# Patient Record
Sex: Female | Born: 1990 | Race: White | Hispanic: No | Marital: Single | Smoking: Current every day smoker
Health system: Southern US, Community
[De-identification: ages and names within clinical notes are randomized; demographics above are authoritative.]

## PROBLEM LIST (undated history)

## (undated) DIAGNOSIS — F32A Depression, unspecified: Secondary | ICD-10-CM

## (undated) DIAGNOSIS — Z8719 Personal history of other diseases of the digestive system: Secondary | ICD-10-CM

## (undated) DIAGNOSIS — F1010 Alcohol abuse, uncomplicated: Secondary | ICD-10-CM

## (undated) DIAGNOSIS — F419 Anxiety disorder, unspecified: Secondary | ICD-10-CM

## (undated) DIAGNOSIS — F329 Major depressive disorder, single episode, unspecified: Secondary | ICD-10-CM

## (undated) DIAGNOSIS — E872 Acidosis: Secondary | ICD-10-CM

## (undated) HISTORY — PX: OTHER SURGICAL HISTORY: SHX169

---

## 2015-07-13 DIAGNOSIS — E8729 Other acidosis: Secondary | ICD-10-CM

## 2015-07-13 DIAGNOSIS — E872 Acidosis: Secondary | ICD-10-CM

## 2015-07-13 DIAGNOSIS — Z8719 Personal history of other diseases of the digestive system: Secondary | ICD-10-CM

## 2015-07-13 HISTORY — DX: Acidosis: E87.2

## 2015-07-13 HISTORY — DX: Personal history of other diseases of the digestive system: Z87.19

## 2015-07-13 HISTORY — DX: Other acidosis: E87.29

## 2015-09-25 ENCOUNTER — Encounter: Payer: Self-pay | Admitting: Emergency Medicine

## 2015-09-25 ENCOUNTER — Emergency Department
Admission: EM | Admit: 2015-09-25 | Discharge: 2015-09-26 | Disposition: A | Discharge status: Home / Self Care | Payer: BLUE CROSS/BLUE SHIELD | Attending: Emergency Medicine | Admitting: Emergency Medicine

## 2015-09-25 DIAGNOSIS — Z3202 Encounter for pregnancy test, result negative: Secondary | ICD-10-CM | POA: Diagnosis not present

## 2015-09-25 DIAGNOSIS — Z79899 Other long term (current) drug therapy: Secondary | ICD-10-CM | POA: Insufficient documentation

## 2015-09-25 DIAGNOSIS — R05 Cough: Secondary | ICD-10-CM | POA: Insufficient documentation

## 2015-09-25 DIAGNOSIS — E876 Hypokalemia: Secondary | ICD-10-CM | POA: Diagnosis not present

## 2015-09-25 DIAGNOSIS — M791 Myalgia: Secondary | ICD-10-CM | POA: Diagnosis present

## 2015-09-25 DIAGNOSIS — F17210 Nicotine dependence, cigarettes, uncomplicated: Secondary | ICD-10-CM | POA: Diagnosis not present

## 2015-09-25 DIAGNOSIS — F1010 Alcohol abuse, uncomplicated: Secondary | ICD-10-CM | POA: Diagnosis not present

## 2015-09-25 LAB — URINALYSIS COMPLETE WITH MICROSCOPIC (ARMC ONLY)
Bilirubin Urine: NEGATIVE
Glucose, UA: NEGATIVE mg/dL
Hgb urine dipstick: NEGATIVE
KETONES UR: NEGATIVE mg/dL
LEUKOCYTES UA: NEGATIVE
Nitrite: NEGATIVE
PROTEIN: NEGATIVE mg/dL
SPECIFIC GRAVITY, URINE: 1.003 — AB (ref 1.005–1.030)
pH: 7 (ref 5.0–8.0)

## 2015-09-25 LAB — COMPREHENSIVE METABOLIC PANEL
ALK PHOS: 75 U/L (ref 38–126)
ALT: 31 U/L (ref 14–54)
ANION GAP: 9 (ref 5–15)
AST: 85 U/L — AB (ref 15–41)
Albumin: 3.9 g/dL (ref 3.5–5.0)
BILIRUBIN TOTAL: 0.5 mg/dL (ref 0.3–1.2)
BUN: 7 mg/dL (ref 6–20)
CALCIUM: 9 mg/dL (ref 8.9–10.3)
CO2: 28 mmol/L (ref 22–32)
Chloride: 107 mmol/L (ref 101–111)
Creatinine, Ser: 0.46 mg/dL (ref 0.44–1.00)
GFR calc Af Amer: >60 mL/min (ref >60)
Glucose, Bld: 97 mg/dL (ref 65–99)
POTASSIUM: 2.6 mmol/L — AB (ref 3.5–5.1)
Sodium: 144 mmol/L (ref 135–145)
TOTAL PROTEIN: 7.9 g/dL (ref 6.5–8.1)

## 2015-09-25 LAB — LIPASE, BLOOD: Lipase: 14 U/L (ref 11–51)

## 2015-09-25 LAB — CBC
HCT: 39.5 % (ref 35.0–47.0)
HEMOGLOBIN: 13.1 g/dL (ref 12.0–16.0)
MCH: 30.7 pg (ref 26.0–34.0)
MCHC: 33.2 g/dL (ref 32.0–36.0)
MCV: 92.7 fL (ref 80.0–100.0)
PLATELETS: 258 10*3/uL (ref 150–440)
RBC: 4.27 MIL/uL (ref 3.80–5.20)
RDW: 13.4 % (ref 11.5–14.5)
WBC: 7 10*3/uL (ref 3.6–11.0)

## 2015-09-25 LAB — URINE DRUG SCREEN, QUALITATIVE (ARMC ONLY)
Amphetamines, Ur Screen: NOT DETECTED
Barbiturates, Ur Screen: NOT DETECTED
Benzodiazepine, Ur Scrn: NOT DETECTED
COCAINE METABOLITE, UR ~~LOC~~: NOT DETECTED
Cannabinoid 50 Ng, Ur ~~LOC~~: POSITIVE — AB
MDMA (Ecstasy)Ur Screen: NOT DETECTED
METHADONE SCREEN, URINE: NOT DETECTED
Opiate, Ur Screen: NOT DETECTED
Phencyclidine (PCP) Ur S: NOT DETECTED
TRICYCLIC, UR SCREEN: NOT DETECTED

## 2015-09-25 LAB — ETHANOL: ALCOHOL ETHYL (B): 418 mg/dL — AB (ref <5)

## 2015-09-25 LAB — INFLUENZA PANEL BY PCR (TYPE A & B)
H1N1 flu by pcr: NOT DETECTED
INFLBPCR: NEGATIVE
Influenza A By PCR: NEGATIVE

## 2015-09-25 LAB — PREGNANCY, URINE: PREG TEST UR: NEGATIVE

## 2015-09-25 LAB — ACETAMINOPHEN LEVEL: Acetaminophen (Tylenol), Serum: <10 ug/mL — ABNORMAL LOW (ref 10–30)

## 2015-09-25 LAB — SALICYLATE LEVEL: Salicylate Lvl: <4 mg/dL (ref 2.8–30.0)

## 2015-09-25 MED ORDER — LORAZEPAM 2 MG/ML IJ SOLN: 0.0000 mg | Freq: Two times a day (BID) | INTRAMUSCULAR | Status: DC

## 2015-09-25 MED ORDER — LORAZEPAM 2 MG/ML IJ SOLN: 0.0000 mg | Freq: Four times a day (QID) | INTRAMUSCULAR | Status: DC

## 2015-09-25 MED ORDER — LORAZEPAM 2 MG PO TABS: 0.0000 mg | ORAL_TABLET | Freq: Two times a day (BID) | ORAL | Status: DC

## 2015-09-25 MED ORDER — THIAMINE HCL 100 MG/ML IJ SOLN: 100.0000 mg | Freq: Every day | INTRAMUSCULAR | Status: DC

## 2015-09-25 MED ORDER — LORAZEPAM 2 MG PO TABS: 0.0000 mg | ORAL_TABLET | Freq: Four times a day (QID) | ORAL | Status: DC

## 2015-09-25 MED ORDER — VITAMIN B-1 100 MG PO TABS: 100.0000 mg | ORAL_TABLET | Freq: Every day | ORAL | Status: DC

## 2015-09-25 MED ORDER — POTASSIUM CHLORIDE IN NACL 20-0.9 MEQ/L-% IV SOLN
Freq: Once | INTRAVENOUS | Status: AC
  Administered 2015-09-25: 22:00:00 via INTRAVENOUS
  Filled 2015-09-25: qty 1000

## 2015-09-25 NOTE — ED Notes (Signed)
Pt sleeping.  nsr on monitor. Skin warm and dry.

## 2015-09-25 NOTE — ED Notes (Signed)
MD malinda at bedside. 

## 2015-09-25 NOTE — [facility] Counselor (Signed)
Per EDP Dr.Malinda, Pt is unable to be assessed at this time [facility]. EDP also communicated that pt  Denies SI, HI and Psychosis and will need to be assisted with [facility] placement.

## 2015-09-25 NOTE — ED Provider Notes (Signed)
[facility] Emergency Department Provider Note  ____________________________________________  Time seen: Approximately 9:24 PM  I have reviewed the triage vital signs and the nursing notes.   HISTORY  Chief Complaint Generalized Body Aches    HPI Eileen Ramos is a 24 y.o. female patient reports she's been drinking 2 shots of alcohol a day. She feels bad she is now feeling achy all over and has been for a couple days. She's not sure if she has a fever. She says she has a cough she thinks it might be a smoker's cough she does smoke a lot. She is part says the cough is productive of a small amount of white phlegm. She denies any abdominal pain urinary symptoms or any other complaints just says she's aching all over mostly.   History reviewed. No pertinent past medical history.  There are no active problems to display for this patient.   History reviewed. No pertinent past surgical history.  Current Outpatient Rx  Name  Route  Sig  Dispense  Refill  . clonazePAM (KLONOPIN) 0.5 MG tablet   Oral   Take 0.5 mg by mouth 3 (three) times daily as needed for anxiety.         Marland Kitchen venlafaxine XR (EFFEXOR-XR) 150 MG 24 hr capsule   Oral   Take 300 mg by mouth daily with breakfast.           Allergies Review of patient's allergies indicates no known allergies.  History reviewed. No pertinent family history.  Social History Social History  Substance Use Topics  . Smoking status: Current Every Day Smoker -- 1.00 packs/day    Types: Cigarettes  . Smokeless tobacco: None  . Alcohol Use: Yes     Comment: a couple of drinks every 4 to 5 days    Review of Systems Constitutional: See history of present illness Eyes: No visual changes. ENT: No sore throat. Cardiovascular: Denies chest pain. Respiratory: Denies shortness of breath. Gastrointestinal: No abdominal pain.  No nausea, no vomiting.  No diarrhea.  No constipation. Genitourinary: Negative for  dysuria. Musculoskeletal: Negative for back pain. Skin: Negative for rash. Neurological: Negative for headaches, focal weakness or numbness.  10-point ROS otherwise negative.  ____________________________________________   PHYSICAL EXAM:  VITAL SIGNS: ED Triage Vitals  Enc Vitals Group     BP 09/25/15 2026 93/59 mmHg     Pulse Rate 09/25/15 2026 91     Resp 09/25/15 2026 18     Temp 09/25/15 2026 98 F (36.7 C)     Temp Source 09/25/15 2026 Oral     SpO2 09/25/15 2026 95 %     Weight 09/25/15 2026 160 lb (72.576 kg)     Height 09/25/15 2026  (1.727 m)     Head Cir --      Peak Flow --      Pain Score 09/25/15 2026 7     Pain Loc --      Pain Edu? --      Excl. in GC? --     Constitutional: Alert and oriented. Appears mildly chronically ill Eyes: Conjunctivae are normal. PERRL. EOMI. Head: Atraumatic. Nose: No congestion/rhinnorhea. Mouth/Throat: Mucous membranes are moist.  Oropharynx non-erythematous. Neck: No stridor.  Cardiovascular: Normal rate, regular rhythm. Grossly normal heart sounds.  Good peripheral circulation. Respiratory: Normal respiratory effort.  No retractions. Lungs CTAB. Gastrointestinal: Soft and patient reports her belly "does not hurt that much" this is a diffuse feeling.. No distention. No abdominal  bruits. No CVA tenderness. Musculoskeletal: No lower extremity tenderness nor edema.  No joint effusions. Neurologic:  Normal speech and language. No gross focal neurologic deficits are appreciated. No gait instability. Skin:  Skin is warm, dry and intact. No rash noted.   ____________________________________________   LABS (all labs ordered are listed, but only abnormal results are displayed)  Labs Reviewed  COMPREHENSIVE METABOLIC PANEL - Abnormal; Notable for the following:    Potassium 2.6 (*)    AST 85 (*)    All other components within normal limits  ETHANOL - Abnormal; Notable for the following:    Alcohol, Ethyl (B) 418 (*)     All other components within normal limits  URINE DRUG SCREEN, QUALITATIVE ([facility] ONLY) - Abnormal; Notable for the following:    Cannabinoid 50 Ng, Ur [place] POSITIVE (*)    All other components within normal limits  URINALYSIS COMPLETEWITH MICROSCOPIC ([facility] ONLY) - Abnormal; Notable for the following:    Color, Urine YELLOW (*)    APPearance CLEAR (*)    Specific Gravity, Urine 1.003 (*)    Bacteria, UA RARE (*)    Squamous Epithelial / LPF 0-5 (*)    All other components within normal limits  CBC  PREGNANCY, URINE  ACETAMINOPHEN LEVEL  SALICYLATE LEVEL  INFLUENZA PANEL BY PCR (TYPE A & B, H1N1)  LIPASE, BLOOD   ____________________________________________  EKG   ____________________________________________  RADIOLOGY   ____________________________________________   PROCEDURES    ____________________________________________   INITIAL IMPRESSION / ASSESSMENT AND PLAN / ED COURSE  Pertinent labs & imaging results that were available during my care of the patient were reviewed by me and considered in my medical decision making (see chart for details). Will start normal saline at with 20 of K2 100 hours because patient's potassium is low her call us for 12 patient wakes up. He put the IV and she had been sleeping and again denies homicidal or suicidal ideation patient's friends tell the nurse [facility] a [facility]. Apparently they brought her in because she didn't want to come she was taking care of her father at home who has stage IV prostate cancer  ____________________________________________   FINAL CLINICAL IMPRESSION(S) / ED DIAGNOSES  Final diagnoses:  Alcohol abuse      Arnaldo NatalPaul F Laquanna Veazey, MD 09/25/15 2300

## 2015-09-25 NOTE — ED Notes (Signed)
Lab called for add on orders.  

## 2015-09-25 NOTE — ED Notes (Signed)
Pt has been put on cardiac monitor, IV started and fluid bolus. Pt denies any HI or SI at this time. Pt calm and cooperative

## 2015-09-25 NOTE — ED Notes (Signed)
Pt states she has felt under the weather for the last couple of days. Pt states "my whole body feels blue." Pt unable to elaborate on that at this time. Pt complains of pain all over. Pt states she is a little depressed but states she does not currently have suicidal thoughts. Pt states she has had a couple of drinks today. Pt states she drinks every 4 to 5 days. Pt denies drug use. Pt slow to respond and does not answer all questions appropriately during triage.

## 2015-09-25 NOTE — ED Notes (Signed)
Pt here for alcohol detox, hx of detox in the past.

## 2015-09-25 NOTE — ED Notes (Signed)
Lab called with etoh of .418 and potassium of 2.6    Dr Darnelle Catalanmalinda aware.

## 2015-09-26 MED ORDER — POTASSIUM CHLORIDE CRYS ER 20 MEQ PO TBCR
40.0000 meq | EXTENDED_RELEASE_TABLET | Freq: Two times a day (BID) | ORAL | Status: DC
Start: 2015-09-26 — End: 2015-09-26
  Administered 2015-09-26: 40 meq via ORAL
  Filled 2015-09-26: qty 2

## 2015-09-26 MED ORDER — POTASSIUM CHLORIDE ER 10 MEQ PO TBCR: 40.0000 meq | EXTENDED_RELEASE_TABLET | Freq: Once | ORAL | Status: DC

## 2015-09-26 NOTE — ED Notes (Signed)
Pt waiting for friends to pick her up then will be given d/c paperwork

## 2015-09-26 NOTE — ED Provider Notes (Signed)
-----------------------------------------   1:38 AM on 09/26/2015 -----------------------------------------   Blood pressure 106/76, pulse 93, temperature 98 F (36.7 C), temperature source Oral, resp. rate 20, height 5\' 8"  (1.727 m), weight 160 lb (72.576 kg), last menstrual period 08/27/2015, SpO2 95 %.  The patient had no acute events since last update.  Calm and cooperative at this time.  Patient not wanting to go home. She is now clinically sober. Able to walk without any assistance with a normal gait. She is calm and reasonable. No slurring of her speech. She said that she would like information for alcohol and substance abuse.  Given contact information for both [facility] as well as [facility]. Will be transported home and supervise by her father. Continues to deny any suicidal or homicidal ideation. Patient will also be given dose of by mouth potassium before she leaves as well as a prescription for 40 mEq to be taken once at home.She has received about 10 mEq via the IV.    Myrna Blazeravid Matthew Shaneika Rossa, MD 09/26/15 [phone]

## 2015-09-26 NOTE — ED Notes (Signed)
MD at bedside. 

## 2015-09-26 NOTE — Discharge Instructions (Signed)
Alcohol Abuse and Nutrition Alcohol abuse is any pattern of alcohol consumption that harms your health, relationships, or work. Alcohol abuse can affect how your body breaks down and absorbs nutrients from food by causing your liver to work abnormally. Additionally, many people who abuse alcohol do not eat enough carbohydrates, protein, fat, vitamins, and minerals. This can cause poor nutrition (malnutrition) and a lack of nutrients (nutrient deficiencies), which can lead to further complications. Nutrients that are commonly lacking (deficient) among people who abuse alcohol include:  Vitamins.  Vitamin A. This is stored in your liver. It is important for your vision, metabolism, and ability to fight off infections (immunity).  B vitamins. These include vitamins such as folate, thiamin, and niacin. These are important in new cell growth and maintenance.  Vitamin C. This plays an important role in iron absorption, wound healing, and immunity.  Vitamin D. This is produced by your liver, but you can also get vitamin D from food. Vitamin D is necessary for your body to absorb and use calcium.  Minerals.  Calcium. This is important for your bones and your heart and blood vessel (cardiovascular) function.  Iron. This is important for blood, muscle, and nervous system functioning.  Magnesium. This plays an important role in muscle and nerve function, and it helps to control blood sugar and blood pressure.  Zinc. This is important for the normal function of your nervous system and digestive system (gastrointestinal tract). Nutrition is an essential component of therapy for alcohol abuse. Your health care provider or dietitian will work with you to design a plan that can help restore nutrients to your body and prevent potential complications. WHAT IS MY PLAN? Your dietitian may develop a specific diet plan that is based on your condition and any other complications you may have. A diet plan will  commonly include:  A balanced diet.  Grains: 6-8 oz per day.  Vegetables: 2-3 cups per day.  Fruits: 1-2 cups per day.  Meat and other protein: 5-6 oz per day.  Dairy: 2-3 cups per day.  Vitamin and mineral supplements. WHAT DO I NEED TO KNOW ABOUT ALCOHOL AND NUTRITION?  Consume foods that are high in antioxidants, such as grapes, berries, nuts, green tea, and dark green and orange vegetables. This can help to counteract some of the stress that is placed on your liver by consuming alcohol.  Avoid food and drinks that are high in fat and sugar. Foods such as sugared soft drinks, salty snack foods, and candy contain empty calories. This means that they lack important nutrients such as protein, fiber, and vitamins.  Eat frequent meals and snacks. Try to eat 5-6 small meals each day.  Eat a variety of fresh fruits and vegetables each day. This will help you get plenty of water, fiber, and vitamins in your diet.  Drink plenty of water and other clear fluids. Try to drink at least 48-64 oz (1.5-2 L) of water per day.  If you are a vegetarian, eat a variety of protein-rich foods. Pair whole grains with plant-based proteins at meals and snacks to obtain the greatest nutrient benefit from your food. For example, eat rice with beans, put peanut butter on whole-grain toast, or eat oatmeal with sunflower seeds.  Soak beans and whole grains overnight before cooking. This can help your body to absorb the nutrients more easily.  Include foods fortified with vitamins and minerals in your diet. Commonly fortified foods include milk, orange juice, cereal, and bread.  If you  are malnourished, your dietitian may recommend a high-protein, high-calorie diet. This may include:  2,000-3,000 calories (kilocalories) per day.  70-100 grams of protein per day.  Your health care provider may recommend a complete nutritional supplement beverage. This can help to restore calories, protein, and vitamins to  your body. Depending on your condition, you may be advised to consume this instead of or in addition to meals.  Limit your intake of caffeine. Replace drinks like coffee and black tea with decaffeinated coffee and herbal tea.  Eat a variety of foods that are high in omega fatty acids. These include fish, nuts and seeds, and soybeans. These foods may help your liver to recover and may also stabilize your mood.  Certain medicines may cause changes in your appetite, taste, and weight. Work with your health care provider and dietitian to make any adjustments to your medicines and diet plan.  Include other healthy lifestyle choices in your daily routine.  Be physically active.  Get enough sleep.  Spend time doing activities that you enjoy.  If you are unable to take in enough food and calories by mouth, your health care provider may recommend a feeding tube. This is a tube that passes through your nose and throat, directly into your stomach. Nutritional supplement beverages can be given to you through the feeding tube to help you get the nutrients you need.  Take vitamin or mineral supplements as recommended by your health care provider. WHAT FOODS CAN I EAT? Grains Enriched pasta. Enriched rice. Fortified whole-grain bread. Fortified whole-grain cereal. Barley. Brown rice. Quinoa. Erwin. Vegetables All fresh, frozen, and canned vegetables. Spinach. Kale. Artichoke. Carrots. Winter squash and pumpkin. Sweet potatoes. Broccoli. Cabbage. Cucumbers. Tomatoes. Sweet peppers. Green beans. Peas. Corn. Fruits All fresh and frozen fruits. Berries. Grapes. Mango. Papaya. Guava. Cherries. Apples. Bananas. Peaches. Plums. Pineapple. Watermelon. Cantaloupe. Oranges. Avocado. Meats and Other Protein Sources Beef liver. Lean beef. Pork. Fresh and canned chicken. Fresh fish. Oysters. Sardines. Canned tuna. Shrimp. Eggs with yolks. Nuts and seeds. Peanut butter. Beans and lentils. Soybeans.  Tofu. Dairy Whole, low-fat, and nonfat milk. Whole, low-fat, and nonfat yogurt. Cottage cheese. Sour cream. Hard and soft cheeses. Beverages Water. Herbal tea. Decaffeinated coffee. Decaffeinated green tea. 100% fruit juice. 100% vegetable juice. Instant breakfast shakes. Condiments Ketchup. Mayonnaise. Mustard. Salad dressing. Barbecue sauce. Sweets and Desserts Sugar-free ice cream. Sugar-free pudding. Sugar-free gelatin. Fats and Oils Butter. Vegetable oil, flaxseed oil, olive oil, and walnut oil. Other Complete nutrition shakes. Protein bars. Sugar-free gum. The items listed above may not be a complete list of recommended foods or beverages. Contact your dietitian for more options. WHAT FOODS ARE NOT RECOMMENDED? Grains Sugar-sweetened breakfast cereals. Flavored instant oatmeal. Fried breads. Vegetables Breaded or deep-fried vegetables. Fruits Dried fruit with added sugar. Candied fruit. Canned fruit in syrup. Meats and Other Protein Sources Breaded or deep-fried meats. Dairy Flavored milks. Fried cheese curds or fried cheese sticks. Beverages Alcohol. Sugar-sweetened soft drinks. Sugar-sweetened tea. Caffeinated coffee and tea. Condiments Sugar. Honey. Agave nectar. Molasses. Sweets and Desserts Chocolate. Cake. Cookies. Candy. Other Potato chips. Pretzels. Salted nuts. Candied nuts. The items listed above may not be a complete list of foods and beverages to avoid. Contact your dietitian for more information.   This information is not intended to replace advice given to you by your health care provider. Make sure you discuss any questions you have with your health care provider.   Document Released: 07/23/2005 Document Revised: 10/19/2014 Document Reviewed: 05/01/2014 Elsevier Interactive Patient  Education 2016 [employer]  Hypokalemia Hypokalemia means that the amount of potassium in the blood is lower than normal.Potassium is a chemical, called an electrolyte,  that helps regulate the amount of fluid in the body. It also stimulates muscle contraction and helps nerves function properly.Most of the body's potassium is inside of cells, and only a very small amount is in the blood. Because the amount in the blood is so small, minor changes can be life-threatening. CAUSES  Antibiotics.  Diarrhea or vomiting.  Using laxatives too much, which can cause diarrhea.  Chronic kidney disease.  Water pills (diuretics).  Eating disorders (bulimia).  Low magnesium level.  Sweating a lot. SIGNS AND SYMPTOMS  Weakness.  Constipation.  Fatigue.  Muscle cramps.  Mental confusion.  Skipped heartbeats or irregular heartbeat (palpitations).  Tingling or numbness. DIAGNOSIS  Your health care provider can diagnose hypokalemia with blood tests. In addition to checking your potassium level, your health care provider may also check other lab tests. TREATMENT Hypokalemia can be treated with potassium supplements taken by mouth or adjustments in your current medicines. If your potassium level is very low, you may need to get potassium through a vein (IV) and be monitored in the hospital. A diet high in potassium is also helpful. Foods high in potassium are:  Nuts, such as peanuts and pistachios.  Seeds, such as sunflower seeds and pumpkin seeds.  Peas, lentils, and lima beans.  Whole grain and bran cereals and breads.  Fresh fruit and vegetables, such as apricots, avocado, bananas, cantaloupe, kiwi, oranges, tomatoes, asparagus, and potatoes.  Orange and tomato juices.  Red meats.  Fruit yogurt. HOME CARE INSTRUCTIONS  Take all medicines as prescribed by your health care provider.  Maintain a healthy diet by including nutritious food, such as fruits, vegetables, nuts, whole grains, and lean meats.  If you are taking a laxative, be sure to follow the directions on the label. SEEK MEDICAL CARE IF:  Your weakness gets worse.  You feel your  heart pounding or racing.  You are vomiting or having diarrhea.  You are diabetic and having trouble keeping your blood glucose in the normal range. SEEK IMMEDIATE MEDICAL CARE IF:  You have chest pain, shortness of breath, or dizziness.  You are vomiting or having diarrhea for more than 2 days.  You faint. MAKE SURE YOU:   Understand these instructions.  Will watch your condition.  Will get help right away if you are not doing well or get worse.   This information is not intended to replace advice given to you by your health care provider. Make sure you discuss any questions you have with your health care provider.   Document Released: 09/28/2005 Document Revised: 10/19/2014 Document Reviewed: 03/31/2013 Elsevier Interactive Patient Education [employer] Elsevier Inc.

## 2015-09-26 NOTE — B[facility] Counselor (Signed)
Per EDP Dr.Schaevit Pt is to be discharged.   Per EDP request, writer provided the pt. with information and instructions on how to access Outpatient [facility] Health & [facility]stance [facility] Treatment ([facility], [facility] and [facility])

## 2015-09-26 NOTE — ED Notes (Addendum)
Pt verbalizes she has to go home, she has her dad and dogs to take care of. RN encouraged pt to stay,Pt denies any SI or HI. MD made aware. Will continue to monitor

## 2015-09-26 NOTE — ED Notes (Signed)
TTS at bedside. 

## 2016-01-05 ENCOUNTER — Inpatient Hospital Stay: Payer: BLUE CROSS/BLUE SHIELD

## 2016-01-05 ENCOUNTER — Encounter: Payer: Self-pay | Admitting: Emergency Medicine

## 2016-01-05 ENCOUNTER — Inpatient Hospital Stay
Admission: EM | Admit: 2016-01-05 | Discharge: 2016-01-06 | DRG: 641 | Disposition: A | Discharge status: Home / Self Care | Payer: BLUE CROSS/BLUE SHIELD | Attending: Internal Medicine | Admitting: Internal Medicine

## 2016-01-05 DIAGNOSIS — K920 Hematemesis: Secondary | ICD-10-CM | POA: Diagnosis present

## 2016-01-05 DIAGNOSIS — Z811 Family history of alcohol abuse and dependence: Secondary | ICD-10-CM

## 2016-01-05 DIAGNOSIS — E861 Hypovolemia: Secondary | ICD-10-CM | POA: Diagnosis present

## 2016-01-05 DIAGNOSIS — F17210 Nicotine dependence, cigarettes, uncomplicated: Secondary | ICD-10-CM | POA: Diagnosis present

## 2016-01-05 DIAGNOSIS — R7989 Other specified abnormal findings of blood chemistry: Secondary | ICD-10-CM

## 2016-01-05 DIAGNOSIS — E872 Acidosis: Secondary | ICD-10-CM | POA: Diagnosis present

## 2016-01-05 DIAGNOSIS — F419 Anxiety disorder, unspecified: Secondary | ICD-10-CM | POA: Diagnosis present

## 2016-01-05 DIAGNOSIS — F329 Major depressive disorder, single episode, unspecified: Secondary | ICD-10-CM | POA: Diagnosis present

## 2016-01-05 DIAGNOSIS — K7010 Alcoholic hepatitis without ascites: Secondary | ICD-10-CM | POA: Diagnosis present

## 2016-01-05 DIAGNOSIS — R1110 Vomiting, unspecified: Secondary | ICD-10-CM

## 2016-01-05 DIAGNOSIS — E876 Hypokalemia: Secondary | ICD-10-CM | POA: Diagnosis not present

## 2016-01-05 DIAGNOSIS — E860 Dehydration: Secondary | ICD-10-CM | POA: Diagnosis present

## 2016-01-05 DIAGNOSIS — E871 Hypo-osmolality and hyponatremia: Principal | ICD-10-CM | POA: Diagnosis present

## 2016-01-05 DIAGNOSIS — Z79899 Other long term (current) drug therapy: Secondary | ICD-10-CM

## 2016-01-05 DIAGNOSIS — F1010 Alcohol abuse, uncomplicated: Secondary | ICD-10-CM | POA: Diagnosis present

## 2016-01-05 DIAGNOSIS — E8729 Other acidosis: Secondary | ICD-10-CM | POA: Diagnosis present

## 2016-01-05 DIAGNOSIS — R945 Abnormal results of liver function studies: Secondary | ICD-10-CM

## 2016-01-05 HISTORY — DX: Major depressive disorder, single episode, unspecified: F32.9

## 2016-01-05 HISTORY — DX: Personal history of other diseases of the digestive system: Z87.19

## 2016-01-05 HISTORY — DX: Anxiety disorder, unspecified: F41.9

## 2016-01-05 HISTORY — DX: Depression, unspecified: F32.A

## 2016-01-05 HISTORY — DX: Alcohol abuse, uncomplicated: F10.10

## 2016-01-05 HISTORY — DX: Acidosis: E87.2

## 2016-01-05 LAB — LIPASE, BLOOD: Lipase: 45 U/L (ref 11–51)

## 2016-01-05 LAB — MAGNESIUM
MAGNESIUM: 2.9 mg/dL — AB (ref 1.7–2.4)
Magnesium: 2.6 mg/dL — ABNORMAL HIGH (ref 1.7–2.4)

## 2016-01-05 LAB — URINE DRUG SCREEN, QUALITATIVE (ARMC ONLY)
AMPHETAMINES, UR SCREEN: NOT DETECTED
BARBITURATES, UR SCREEN: NOT DETECTED
BENZODIAZEPINE, UR SCRN: NOT DETECTED
CANNABINOID 50 NG, UR ~~LOC~~: POSITIVE — AB
Cocaine Metabolite,Ur ~~LOC~~: NOT DETECTED
MDMA (ECSTASY) UR SCREEN: NOT DETECTED
METHADONE SCREEN, URINE: NOT DETECTED
OPIATE, UR SCREEN: NOT DETECTED
PHENCYCLIDINE (PCP) UR S: NOT DETECTED
TRICYCLIC, UR SCREEN: NOT DETECTED

## 2016-01-05 LAB — CBC WITH DIFFERENTIAL/PLATELET
BASOS PCT: 0 %
Basophils Absolute: 0 10*3/uL (ref 0–0.1)
EOS ABS: 0 10*3/uL (ref 0–0.7)
Eosinophils Relative: 0 %
HEMATOCRIT: 36.4 % (ref 35.0–47.0)
HEMOGLOBIN: 12.7 g/dL (ref 12.0–16.0)
LYMPHS ABS: 0.5 10*3/uL — AB (ref 1.0–3.6)
Lymphocytes Relative: 5 %
MCH: 39 pg — AB (ref 26.0–34.0)
MCHC: 35 g/dL (ref 32.0–36.0)
MCV: 111.6 fL — ABNORMAL HIGH (ref 80.0–100.0)
MONO ABS: 1.2 10*3/uL — AB (ref 0.2–0.9)
MONOS PCT: 11 %
NEUTROS PCT: 84 %
Neutro Abs: 9.6 10*3/uL — ABNORMAL HIGH (ref 1.4–6.5)
Platelets: 202 10*3/uL (ref 150–440)
RBC: 3.26 MIL/uL — ABNORMAL LOW (ref 3.80–5.20)
RDW: 16 % — AB (ref 11.5–14.5)
WBC: 11.4 10*3/uL — ABNORMAL HIGH (ref 3.6–11.0)

## 2016-01-05 LAB — URINALYSIS COMPLETE WITH MICROSCOPIC (ARMC ONLY)
Bacteria, UA: NONE SEEN
GLUCOSE, UA: 50 mg/dL — AB
LEUKOCYTES UA: NEGATIVE
NITRITE: NEGATIVE
Protein, ur: 100 mg/dL — AB
SPECIFIC GRAVITY, URINE: 1.023 (ref 1.005–1.030)
pH: 6 (ref 5.0–8.0)

## 2016-01-05 LAB — COMPREHENSIVE METABOLIC PANEL
ALBUMIN: 4.1 g/dL (ref 3.5–5.0)
ALK PHOS: 245 U/L — AB (ref 38–126)
ALT: 89 U/L — ABNORMAL HIGH (ref 14–54)
ANION GAP: 30 — AB (ref 5–15)
AST: 409 U/L — ABNORMAL HIGH (ref 15–41)
BILIRUBIN TOTAL: 12.9 mg/dL — AB (ref 0.3–1.2)
BUN: 24 mg/dL — ABNORMAL HIGH (ref 6–20)
CALCIUM: 9.7 mg/dL (ref 8.9–10.3)
CO2: 19 mmol/L — ABNORMAL LOW (ref 22–32)
Chloride: 73 mmol/L — ABNORMAL LOW (ref 101–111)
Creatinine, Ser: 0.87 mg/dL (ref 0.44–1.00)
GFR calc non Af Amer: >60 mL/min (ref >60)
Glucose, Bld: 158 mg/dL — ABNORMAL HIGH (ref 65–99)
POTASSIUM: 2.9 mmol/L — AB (ref 3.5–5.1)
SODIUM: 122 mmol/L — AB (ref 135–145)
TOTAL PROTEIN: 9.4 g/dL — AB (ref 6.5–8.1)

## 2016-01-05 LAB — ETHANOL: Alcohol, Ethyl (B): <5 mg/dL (ref <5)

## 2016-01-05 LAB — GLUCOSE, CAPILLARY: Glucose-Capillary: 151 mg/dL — ABNORMAL HIGH (ref 65–99)

## 2016-01-05 LAB — OSMOLALITY: OSMOLALITY: 280 mosm/kg (ref 275–295)

## 2016-01-05 LAB — TSH: TSH: 2.335 u[IU]/mL (ref 0.350–4.500)

## 2016-01-05 LAB — PHOSPHORUS: Phosphorus: <1 mg/dL — CL (ref 2.5–4.6)

## 2016-01-05 LAB — PROTIME-INR
INR: 1.19
PROTHROMBIN TIME: 15.3 s — AB (ref 11.4–15.0)

## 2016-01-05 LAB — POCT PREGNANCY, URINE: Preg Test, Ur: NEGATIVE

## 2016-01-05 LAB — APTT: aPTT: 41 seconds — ABNORMAL HIGH (ref 24–36)

## 2016-01-05 MED ORDER — ADULT MULTIVITAMIN W/MINERALS CH
1.0000 | ORAL_TABLET | Freq: Every day | ORAL | Status: DC
  Administered 2016-01-05 – 2016-01-06 (×2): 1 via ORAL
  Filled 2016-01-05 (×2): qty 1

## 2016-01-05 MED ORDER — CLONAZEPAM 0.5 MG PO TABS
0.5000 mg | ORAL_TABLET | Freq: Three times a day (TID) | ORAL | Status: DC | PRN
  Administered 2016-01-05: 0.5 mg via ORAL
  Filled 2016-01-05 (×2): qty 1

## 2016-01-05 MED ORDER — FOLIC ACID 5 MG/ML IJ SOLN
Freq: Every day | INTRAVENOUS | Status: DC
  Administered 2016-01-05: 14:00:00 via INTRAVENOUS
  Filled 2016-01-05 (×2): qty 0.2

## 2016-01-05 MED ORDER — DEXTROSE 5 % IV SOLN
Freq: Once | INTRAVENOUS | Status: AC
  Administered 2016-01-05: 14:00:00 via INTRAVENOUS

## 2016-01-05 MED ORDER — THIAMINE HCL 100 MG/ML IJ SOLN: 100.0000 mg | Freq: Every day | INTRAMUSCULAR | Status: DC

## 2016-01-05 MED ORDER — VITAMIN B-1 100 MG PO TABS
100.0000 mg | ORAL_TABLET | Freq: Every day | ORAL | Status: DC
  Administered 2016-01-06: 100 mg via ORAL
  Filled 2016-01-05: qty 1

## 2016-01-05 MED ORDER — SODIUM CHLORIDE 0.9 % IV BOLUS (SEPSIS)
1000.0000 mL | INTRAVENOUS | Status: AC
  Administered 2016-01-05: 1000 mL via INTRAVENOUS

## 2016-01-05 MED ORDER — SODIUM CHLORIDE 0.9% FLUSH: 3.0000 mL | Freq: Two times a day (BID) | INTRAVENOUS | Status: DC

## 2016-01-05 MED ORDER — PREDNISONE 20 MG PO TABS
40.0000 mg | ORAL_TABLET | Freq: Every day | ORAL | Status: DC
  Administered 2016-01-06: 40 mg via ORAL
  Filled 2016-01-05: qty 2

## 2016-01-05 MED ORDER — THIAMINE HCL 100 MG/ML IJ SOLN
100.0000 mg | Freq: Once | INTRAMUSCULAR | Status: AC
  Administered 2016-01-05: 100 mg via INTRAVENOUS
  Filled 2016-01-05: qty 2

## 2016-01-05 MED ORDER — LORAZEPAM 2 MG/ML IJ SOLN: 0.0000 mg | Freq: Four times a day (QID) | INTRAMUSCULAR | Status: DC

## 2016-01-05 MED ORDER — HEPARIN SODIUM (PORCINE) 5000 UNIT/ML IJ SOLN
5000.0000 [IU] | Freq: Three times a day (TID) | INTRAMUSCULAR | Status: DC
  Administered 2016-01-05 – 2016-01-06 (×2): 5000 [IU] via SUBCUTANEOUS
  Filled 2016-01-05 (×2): qty 1

## 2016-01-05 MED ORDER — LORAZEPAM 2 MG/ML IJ SOLN: 1.0000 mg | Freq: Four times a day (QID) | INTRAMUSCULAR | Status: DC | PRN

## 2016-01-05 MED ORDER — LORAZEPAM 2 MG/ML IJ SOLN: 0.0000 mg | Freq: Two times a day (BID) | INTRAMUSCULAR | Status: DC

## 2016-01-05 MED ORDER — MAGNESIUM SULFATE 2 GM/50ML IV SOLN
2.0000 g | Freq: Once | INTRAVENOUS | Status: AC
  Administered 2016-01-05: 2 g via INTRAVENOUS
  Filled 2016-01-05: qty 50

## 2016-01-05 MED ORDER — FOLIC ACID 1 MG PO TABS
1.0000 mg | ORAL_TABLET | Freq: Every day | ORAL | Status: DC
  Administered 2016-01-06: 1 mg via ORAL
  Filled 2016-01-05: qty 1

## 2016-01-05 MED ORDER — ONDANSETRON HCL 4 MG/2ML IJ SOLN: 4.0000 mg | Freq: Four times a day (QID) | INTRAMUSCULAR | Status: DC | PRN

## 2016-01-05 MED ORDER — VENLAFAXINE HCL ER 75 MG PO CP24
300.0000 mg | ORAL_CAPSULE | Freq: Every day | ORAL | Status: DC
  Administered 2016-01-06: 300 mg via ORAL
  Filled 2016-01-05: qty 4

## 2016-01-05 MED ORDER — ONDANSETRON HCL 4 MG PO TABS: 4.0000 mg | ORAL_TABLET | Freq: Four times a day (QID) | ORAL | Status: DC | PRN

## 2016-01-05 MED ORDER — POTASSIUM CL IN DEXTROSE 5% 20 MEQ/L IV SOLN
20.0000 meq | INTRAVENOUS | Status: DC
  Administered 2016-01-05 – 2016-01-06 (×2): 20 meq via INTRAVENOUS
  Filled 2016-01-05 (×5): qty 1000

## 2016-01-05 MED ORDER — FOLIC ACID 5 MG/ML IJ SOLN
1.0000 mg | Freq: Every day | INTRAMUSCULAR | Status: DC
  Filled 2016-01-05: qty 0.2

## 2016-01-05 MED ORDER — POTASSIUM CHLORIDE 10 MEQ/100ML IV SOLN
10.0000 meq | INTRAVENOUS | Status: AC
  Administered 2016-01-05 (×3): 10 meq via INTRAVENOUS
  Filled 2016-01-05 (×7): qty 100

## 2016-01-05 MED ORDER — POTASSIUM CHLORIDE 10 MEQ/100ML IV SOLN
10.0000 meq | INTRAVENOUS | Status: AC
  Administered 2016-01-05 – 2016-01-06 (×3): 10 meq via INTRAVENOUS
  Filled 2016-01-05 (×3): qty 100

## 2016-01-05 MED ORDER — POTASSIUM CHLORIDE 10 MEQ/100ML IV SOLN: 10.0000 meq | INTRAVENOUS | Status: DC

## 2016-01-05 MED ORDER — HYDROCODONE-ACETAMINOPHEN 5-325 MG PO TABS: 1.0000 | ORAL_TABLET | ORAL | Status: DC | PRN

## 2016-01-05 MED ORDER — LORAZEPAM 1 MG PO TABS: 1.0000 mg | ORAL_TABLET | Freq: Four times a day (QID) | ORAL | Status: DC | PRN

## 2016-01-05 MED ORDER — ONDANSETRON HCL 4 MG/2ML IJ SOLN
4.0000 mg | Freq: Once | INTRAMUSCULAR | Status: DC
  Filled 2016-01-05: qty 2

## 2016-01-05 MED ORDER — VITAMIN B-1 100 MG PO TABS: 100.0000 mg | ORAL_TABLET | Freq: Every day | ORAL | Status: DC

## 2016-01-05 MED ORDER — HYDRALAZINE HCL 20 MG/ML IJ SOLN
10.0000 mg | Freq: Four times a day (QID) | INTRAMUSCULAR | Status: DC | PRN
  Administered 2016-01-05: 10 mg via INTRAVENOUS
  Filled 2016-01-05: qty 1

## 2016-01-05 MED ORDER — DIPHENHYDRAMINE HCL 25 MG PO CAPS
25.0000 mg | ORAL_CAPSULE | Freq: Every evening | ORAL | Status: DC | PRN
  Administered 2016-01-05: 25 mg via ORAL
  Filled 2016-01-05 (×2): qty 1

## 2016-01-05 NOTE — ED Notes (Signed)
Pt presents to ED via EMS from home with vomiting x4 days. Pt had blood in vomit around 10 am this morning.

## 2016-01-05 NOTE — Progress Notes (Signed)
Patient admitted from ED. Resting comfortably in bed. BP elevated. Dr Allena KatzPatel notified and ordered hydralazine. No complaints of nausea or vomiting at this time. IV fluids and potassium infusing. Continue to monitor.

## 2016-01-05 NOTE — ED Provider Notes (Signed)
[facility] Emergency Department Provider Note  ____________________________________________  Time seen: Approximately 1:05 PM  I have reviewed the triage vital signs and the nursing notes.   HISTORY  Chief Complaint Vomiting and Hematemesis    HPI Eileen Ramos is a 25 y.o. female with a psychiatric history of anxiety and alcohol abuse who presents with 4 days of persistent and intractable vomiting.  She denies diarrhea and abdominal pain, fever/chills, CP, SOB.  Nothing makes the vomiting better, and trying to eat or drink anything makes it worse.    She reports she had a similar issue about 6 months ago and states she had "hepatitis, I think it was 'A'" and [facility] . She reports (with some coaxing) a history of heavy alcohol use.  When asked specifically what she drinks, she admits that she drinks homemade moonshine and has done so for years.  I asked how many shots she drinks a day and states that it is "down to" 5-6 per day, spread out throughout the day.  She states that the last shot she had was yesterday because she has been vomiting so much.  She states she has never had any seizures when she stops drinking, has not been having any hallucinations, and states that she stopped drinking last year for a couple of months with no negative side effects.   Past Medical History  Diagnosis Date  . History of pancreatitis Oct 2016    [facility]  . H/O acute alcoholic hepatitis Oct 2016    [facility]  . Alcoholic ketoacidosis Oct 2016    [facility]  . Alcohol abuse   . Anxiety     There are no active problems to display for this patient.   History reviewed. No pertinent past surgical history.  Current Outpatient Rx  Name  Route  Sig  Dispense  Refill  . clonazePAM (KLONOPIN) 0.5 MG tablet   Oral   Take 0.5 mg by mouth 3 (three) times daily as needed for anxiety.         . potassium chloride (K-DUR) 10 MEQ tablet   Oral   Take 4 tablets (40 mEq total) by mouth once.   4 tablet   0   . venlafaxine XR (EFFEXOR-XR) 150 MG 24 hr capsule   Oral   Take 300 mg by mouth daily with breakfast.           Allergies Review of patient's allergies indicates no known allergies.  History reviewed. No pertinent family history.  Social History Social History  Substance Use Topics  . Smoking status: Current Every Day Smoker -- 1.00 packs/day    Types: Cigarettes  . Smokeless tobacco: None  . Alcohol Use: Yes     Comment: a couple of drinks every 4 to 5 days    Review of Systems Constitutional: No fever/chills Eyes: No visual changes. ENT: No sore throat. Cardiovascular: Denies chest pain. Respiratory: Denies shortness of breath. Gastrointestinal: No abdominal pain.  Profuse, intractable vomiting.  No diarrhea.  No constipation. Genitourinary: Negative for dysuria. Musculoskeletal: Negative for back pain. Skin: Negative for rash. Neurological: Negative for headaches, focal weakness or numbness.  10-point ROS otherwise negative.  ____________________________________________   PHYSICAL EXAM:  VITAL SIGNS: ED Triage Vitals  Enc Vitals Group     BP 01/05/16 1206 149/104 mmHg     Pulse Rate 01/05/16 1206 115     Resp 01/05/16 1206 20     Temp 01/05/16  1206 98.6 F (37 C)     Temp Source 01/05/16 1206 Oral     SpO2 01/05/16 1206 97 %     Weight 01/05/16 1206 145 lb (65.772 kg)     Height 01/05/16 1206  (1.727 m)     Head Cir --      Peak Flow --      Pain Score --      Pain Loc --      Pain Edu? --      Excl. in GC? --     Constitutional: Alert and oriented.  Jaundice, ill appearing but nontoxic Eyes: Scleral icterus. PERRL. EOMI. Head: Atraumatic. Nose: No congestion/rhinnorhea. Mouth/Throat: Mucous membranes are moist.  Oropharynx non-erythematous. Neck: No stridor.  No meningeal signs.   Cardiovascular: Tachycardia, regular rhythm. Good peripheral circulation. Grossly normal heart  sounds.   Respiratory: Normal respiratory effort.  No retractions. Lungs CTAB. Gastrointestinal: Soft with tenderness to palpation of the epigastrium and right upper quadrant.  No lower abdominal tenderness to palpation. Musculoskeletal: No lower extremity tenderness nor edema. No gross deformities of extremities. Neurologic:  Normal speech and language. No gross focal neurologic deficits are appreciated.  Skin:  Skin is warm, dry and intact. No rash noted. Psychiatric: The patient has a very flat affect and slow speech but is otherwise appropriate.  She expresses no suicidality  ____________________________________________   LABS (all labs ordered are listed, but only abnormal results are displayed)  Labs Reviewed  CBC WITH DIFFERENTIAL/PLATELET - Abnormal; Notable for the following:    WBC 11.4 (*)    RBC 3.26 (*)    MCV 111.6 (*)    MCH 39.0 (*)    RDW 16.0 (*)    Neutro Abs 9.6 (*)    Lymphs Abs 0.5 (*)    Monocytes Absolute 1.2 (*)    All other components within normal limits  COMPREHENSIVE METABOLIC PANEL - Abnormal; Notable for the following:    Sodium 122 (*)    Potassium 2.9 (*)    Chloride 73 (*)    CO2 19 (*)    Glucose, Bld 158 (*)    BUN 24 (*)    Total Protein 9.4 (*)    AST 409 (*)    ALT 89 (*)    Alkaline Phosphatase 245 (*)    Total Bilirubin 12.9 (*)    Anion gap 30 (*)    All other components within normal limits  URINALYSIS COMPLETEWITH MICROSCOPIC ([facility] ONLY) - Abnormal; Notable for the following:    Color, Urine AMBER (*)    APPearance CLEAR (*)    Glucose, UA 50 (*)    Bilirubin Urine 1+ (*)    Ketones, ur 2+ (*)    Hgb urine dipstick 1+ (*)    Protein, ur 100 (*)    Squamous Epithelial / LPF 0-5 (*)    All other components within normal limits  GLUCOSE, CAPILLARY - Abnormal; Notable for the following:    Glucose-Capillary 151 (*)    All other components within normal limits  PROTIME-INR - Abnormal; Notable for the following:    Prothrombin  Time 15.3 (*)    All other components within normal limits  APTT - Abnormal; Notable for the following:    aPTT 41 (*)    All other components within normal limits  LIPASE, BLOOD  ETHANOL  OSMOLALITY  URINE DRUG SCREEN, QUALITATIVE ([facility] ONLY)  MAGNESIUM  PHOSPHORUS  POC URINE PREG, ED  POCT PREGNANCY, URINE   ____________________________________________  EKG  ED ECG REPORT I, Sergi Gellner, the attending physician, personally viewed and interpreted this ECG.   Date: 01/05/2016  EKG Time: 12:08  Rate: 114  Rhythm: sinus tachycardia  Axis: Normal  Intervals:Prolonged QTc interval of 511, otherwise unremarkable  ST&T Change: Non-specific ST segment / T-wave changes, but no evidence of acute ischemia.  ____________________________________________  RADIOLOGY   No results found.  ____________________________________________   PROCEDURES  Procedure(s) performed: None  Critical Care performed: Yes, see critical care note(s)   CRITICAL CARE Performed by: Loleta Rose   Total critical care time: 60 minutes  Critical care time was exclusive of separately billable procedures and treating other patients.  Critical care was necessary to treat or prevent imminent or life-threatening deterioration.  Critical care was time spent personally by me on the following activities: development of treatment plan with patient and/or surrogate as well as nursing, discussions with consultants, evaluation of patient's response to treatment, examination of patient, obtaining history from patient or surrogate, ordering and performing treatments and interventions, ordering and review of laboratory studies, ordering and review of radiographic studies, pulse oximetry and re-evaluation of patient's condition.  ____________________________________________   INITIAL IMPRESSION / ASSESSMENT AND PLAN / ED COURSE  Pertinent labs & imaging results that were available during my care of the  patient were reviewed by me and considered in my medical decision making (see chart for details).  Review of the patient's note from [facility] in care everywhere confirmed that she was diagnosed with alcoholic hepatitis, alcoholic ketoacidosis, alcoholic pancreatitis, and offered admission, but she declined because she needs to take care of her father.  As per the H&P, I confirmed that she is continued to drink 5-6 shots of homemade moonshine a day.  She currently presents with signs and symptoms consistent with alcoholic ketoacidosis.  When her labs came back, there are notable for an anion gap of 30, T bili of 13, numerous electrolyte abnormalities, all consistent with acute liver failure and alcoholic ketoacidosis.  I have ordered thiamine 100 mg to be administered IV, to be followed by a D5 infusion.  She is also getting folic acid 1 mg IV.  I ordered magnesium sulfate 2 g IV and potassium chloride 10 mEq IV per hour 6 hours (to be adjusted as needed by the hospitalist).  She has gotten 1 L of normal saline so far and continues to be tachycardic at about 115.  No indication for emergent abdominal imaging at this time; she needs to be treated medically and seen by a gastroenterologist.  I also placed her on CIWA protocol.    I discussed how critically ill she is with the patient and explained that she must let us admit her this time, and she understands and agrees with the need to come into the hospital.  I have spoken with the hospitalist (Dr. Allena Katz) about this ICU admission.  ____________________________________________  FINAL CLINICAL IMPRESSION(S) / ED DIAGNOSES  Final diagnoses:  Alcoholic ketoacidosis  Alcoholic hepatitis without ascites  Hypokalemia  Hypomagnesemia  Intractable vomiting with nausea, vomiting of unspecified type  Hyperbilirubinemia  Hyponatremia      NEW MEDICATIONS STARTED DURING THIS VISIT:  New Prescriptions   No medications on file      Note:  This document  was prepared using Dragon voice recognition software and may include unintentional dictation errors.   Loleta Rose, MD 01/05/16 [phone]

## 2016-01-05 NOTE — H&P (Signed)
[facility] - [place] at [facility]   PATIENT NAME: Eileen Ramos    MR#:  [id number]  DATE OF BIRTH:  July 24, 1991  DATE OF ADMISSION:  01/05/2016  PRIMARY CARE PHYSICIAN: WHITE, Arlyss Repress, NP   REQUESTING/REFERRING PHYSICIAN: Winn Jock MD  CHIEF COMPLAINT:   Chief Complaint  Patient presents with  . Vomiting  . Hematemesis    HISTORY OF PRESENT ILLNESS: Eileen Ramos  is a 25 y.o. female with a known history of  alcohol abuse, history of alcoholic hepatitis and anxiety and depression. Who states that she's been having nausea vomiting ongoing for the past 4 days. Unable to keep any food down. Her last drink was yesterday. She drinks 5 shots of liquor daily. She denies any diarrhea. Denies any fevers chills no chest pain or shortness of breath complains of mild epigastric discomfort. PAST MEDICAL HISTORY:   Past Medical History  Diagnosis Date  . History of pancreatitis Oct 2016    diagnosed at [facility]  . H/O acute alcoholic hepatitis Oct 2016    Diagnosed at [facility]  . Alcoholic ketoacidosis Oct 2016    Diagnosed at [facility]  . Alcohol abuse   . Anxiety   . Depression     PAST SURGICAL HISTORY: Past Surgical History  Procedure Laterality Date  . None      SOCIAL HISTORY:  Social History  Substance Use Topics  . Smoking status: Current Every Day Smoker -- 1.00 packs/day    Types: Cigarettes  . Smokeless tobacco: Not on file  . Alcohol Use: 18.0 oz/week    30 Standard drinks or equivalent per week     Comment: a couple of drinks every 4 to 5 days    FAMILY HISTORY:  Family History  Problem Relation Age of Onset  . Alcohol abuse Father     DRUG ALLERGIES: No Known Allergies  REVIEW OF SYSTEMS:   CONSTITUTIONAL: No fever, fatigue or weakness.  EYES: No blurred or double vision.  EARS, NOSE, AND THROAT: No tinnitus or ear pain.  RESPIRATORY: No cough, shortness of breath, wheezing or hemoptysis.  CARDIOVASCULAR: No chest pain,  orthopnea, edema.  GASTROINTESTINAL:  postive  nausea,  And vomiting,  Mild epigastric tenderness GENITOURINARY: No dysuria, hematuria.  ENDOCRINE: No polyuria, nocturia,  HEMATOLOGY: No anemia, easy bruising or bleeding SKIN: No rash or lesion. MUSCULOSKELETAL: No joint pain or arthritis.   NEUROLOGIC: No tingling, numbness, weakness.  PSYCHIATRY: No anxiety or depression.   MEDICATIONS AT HOME:  Prior to Admission medications   Medication Sig Start Date End Date Taking? Authorizing Provider  clonazePAM (KLONOPIN) 0.5 MG tablet Take 0.5 mg by mouth 3 (three) times daily as needed for anxiety.    Historical Provider, MD  potassium chloride (K-DUR) 10 MEQ tablet Take 4 tablets (40 mEq total) by mouth once. 09/26/15   Myrna Blazer, MD  venlafaxine XR (EFFEXOR-XR) 150 MG 24 hr capsule Take 300 mg by mouth daily with breakfast.    Historical Provider, MD      PHYSICAL EXAMINATION:   VITAL SIGNS: Blood pressure 149/104, pulse 115, temperature 98.6 F (37 C), temperature source Oral, resp. rate 20, height  (1.727 m), weight 65.772 kg (145 lb), SpO2 97 %.  GENERAL:  25 y.o.-year-old patient lying in the bed with no acute distress.  EYES: Pupils equal, round, reactive to light and accommodation. No scleral icterus. Extraocular muscles intact.  HEENT: Head atraumatic, normocephalic. Oropharynx and nasopharynx clear.  NECK:  Supple, no jugular  venous distention. No thyroid enlargement, no tenderness.  LUNGS: Normal breath sounds bilaterally, no wheezing, rales,rhonchi or crepitation. No use of accessory muscles of respiration.  CARDIOVASCULAR: S1, S2 normal. No murmurs, rubs, or gallops.  ABDOMEN: Soft, mild epigastric tenderness, nondistended. Bowel sounds present. No organomegaly or mass.  EXTREMITIES: No pedal edema, cyanosis, or clubbing.  NEUROLOGIC: Cranial nerves II through XII are intact. Muscle strength 5/5 in all extremities. Sensation intact. Gait not checked.   PSYCHIATRIC: The patient is alert and oriented x 3.  SKIN: No obvious rash, lesion, or ulcer.   LABORATORY PANEL:   CBC  Recent Labs Lab 01/05/16 1216  WBC 11.4*  HGB 12.7  HCT 36.4  PLT 202  MCV 111.6*  MCH 39.0*  MCHC 35.0  RDW 16.0*  LYMPHSABS 0.5*  MONOABS 1.2*  EOSABS 0.0  BASOSABS 0.0   ------------------------------------------------------------------------------------------------------------------  Chemistries   Recent Labs Lab 01/05/16 1216  NA 122*  K 2.9*  CL 73*  CO2 19*  GLUCOSE 158*  BUN 24*  CREATININE 0.87  CALCIUM 9.7  AST 409*  ALT 89*  ALKPHOS 245*  BILITOT 12.9*   ------------------------------------------------------------------------------------------------------------------ estimated creatinine clearance is 99.7 mL/min (by C-G formula based on Cr of 0.87). ------------------------------------------------------------------------------------------------------------------ No results for input(s): TSH, T4TOTAL, T3FREE, THYROIDAB in the last 72 hours.  Invalid input(s): FREET3   Coagulation profile  Recent Labs Lab 01/05/16 1216  INR 1.19   ------------------------------------------------------------------------------------------------------------------- No results for input(s): DDIMER in the last 72 hours. -------------------------------------------------------------------------------------------------------------------  Cardiac Enzymes No results for input(s): CKMB, TROPONINI, MYOGLOBIN in the last 168 hours.  Invalid input(s): CK ------------------------------------------------------------------------------------------------------------------ Invalid input(s): POCBNP  ---------------------------------------------------------------------------------------------------------------  Urinalysis    Component Value Date/Time   COLORURINE AMBER* 01/05/2016 1344   APPEARANCEUR CLEAR* 01/05/2016 1344   LABSPEC 1.023 01/05/2016  1344   PHURINE 6.0 01/05/2016 1344   GLUCOSEU 50* 01/05/2016 1344   HGBUR 1+* 01/05/2016 1344   BILIRUBINUR 1+* 01/05/2016 1344   KETONESUR 2+* 01/05/2016 1344   PROTEINUR 100* 01/05/2016 1344   NITRITE NEGATIVE 01/05/2016 1344   LEUKOCYTESUR NEGATIVE 01/05/2016 1344     RADIOLOGY: No results found.  EKG: Orders placed or performed during the hospital encounter of 01/05/16  . EKG 12-Lead  . EKG 12-Lead    IMPRESSION AND PLAN: Patient is a 25 year old white female with alcohol abuse presents with nausea vomiting  1. Severe nausea and vomiting likely related to alcoholic hepatitis- Will admit patient give her aggressive IV fluids and antiemetics.  2. Alcoholic ketoacidosis-again aggressive IV fluids.  3. Acute alcoholic hepatitis will start patient on prednisone 40 mg daily  4. Severe hypokalemia due to dehydration we'll replace potassium and magnesium  5. Hyponatremia likely due to combination of alcohol-induced hyponatremia as well as dehydration we'll give her normal saline follow her sodium level  6. Alcohol abuse we'll place on CIWA protocol  7. Misc: heparin for dvt proph   All the records are reviewed and case discussed with ED provider. Management plans discussed with the patient, family and they are in agreement.  CODE STATUS: Code Status History    This patient does not have a recorded code status. Please follow your organizational policy for patients in this situation.       TOTAL TIME TAKING CARE OF THIS PATIENT55 minutes.    Auburn Bilberry M.D on 01/05/2016 at 2:17 PM  Between 7am to 6pm - Pager - [phone]  After 6pm go to [email or web address] - Scientist, research (life sciences) Hospitalists  Office  [phone]  CC: Primary care physician; WHITE, Arlyss RepressELIZABETH BURNEY, NP

## 2016-01-05 NOTE — Progress Notes (Signed)
Pt requesting Latuda 20 mg. Dr Anne HahnWillis notified. He will order benadryl instead because of pt's condition

## 2016-01-05 NOTE — ED Notes (Signed)
Pt in procedure

## 2016-01-06 LAB — CBC
HEMATOCRIT: 33.7 % — AB (ref 35.0–47.0)
Hemoglobin: 12 g/dL (ref 12.0–16.0)
MCH: 40.2 pg — ABNORMAL HIGH (ref 26.0–34.0)
MCHC: 35.6 g/dL (ref 32.0–36.0)
MCV: 112.9 fL — ABNORMAL HIGH (ref 80.0–100.0)
Platelets: 183 10*3/uL (ref 150–440)
RBC: 2.98 MIL/uL — ABNORMAL LOW (ref 3.80–5.20)
RDW: 15.4 % — AB (ref 11.5–14.5)
WBC: 6.8 10*3/uL (ref 3.6–11.0)

## 2016-01-06 LAB — COMPREHENSIVE METABOLIC PANEL
ALBUMIN: 3.6 g/dL (ref 3.5–5.0)
ALK PHOS: 220 U/L — AB (ref 38–126)
ALT: 85 U/L — ABNORMAL HIGH (ref 14–54)
AST: 452 U/L — AB (ref 15–41)
Anion gap: 20 — ABNORMAL HIGH (ref 5–15)
BILIRUBIN TOTAL: 13.2 mg/dL — AB (ref 0.3–1.2)
BUN: 13 mg/dL (ref 6–20)
CO2: 25 mmol/L (ref 22–32)
Calcium: 9.3 mg/dL (ref 8.9–10.3)
Chloride: 80 mmol/L — ABNORMAL LOW (ref 101–111)
Creatinine, Ser: 0.53 mg/dL (ref 0.44–1.00)
GFR calc Af Amer: >60 mL/min (ref >60)
GFR calc non Af Amer: >60 mL/min (ref >60)
GLUCOSE: 139 mg/dL — AB (ref 65–99)
POTASSIUM: 3.1 mmol/L — AB (ref 3.5–5.1)
Sodium: 125 mmol/L — ABNORMAL LOW (ref 135–145)
TOTAL PROTEIN: 8.2 g/dL — AB (ref 6.5–8.1)

## 2016-01-06 LAB — HEPATITIS C ANTIBODY: HCV Ab: 0.1 s/co ratio (ref 0.0–0.9)

## 2016-01-06 MED ORDER — POTASSIUM CHLORIDE IN NACL 20-0.9 MEQ/L-% IV SOLN
INTRAVENOUS | Status: DC
  Filled 2016-01-06 (×2): qty 1000

## 2016-01-06 NOTE — Progress Notes (Signed)
[place] at [place] NAME: Eileen Ramos    MR#:  [id number]  DATE OF BIRTH:  1990-11-18  SUBJECTIVE:  CHIEF COMPLAINT:   Chief Complaint  Patient presents with  . Vomiting  . Hematemesis   - On history of alcohol abuse, alcoholic hepatitis anxiety and depression admitted with nausea and vomiting. Noted to have hyponatremia. -Patient wanted to leave against medical advise to check on her dad. I personally went into detox the patient, explained all the risks with her being unstable of discharge. Patient understood everything and still wanted to leave. She is alert oriented and capable of making her medical decisions.  REVIEW OF SYSTEMS:  Review of Systems  Constitutional: Negative for fever and chills.  HENT: Negative for ear discharge, ear pain and nosebleeds.   Eyes: Negative for blurred vision and double vision.  Respiratory: Negative for cough, shortness of breath and wheezing.   Cardiovascular: Negative for chest pain, palpitations and leg swelling.  Gastrointestinal: Positive for nausea. Negative for vomiting, abdominal pain, diarrhea and constipation.  Genitourinary: Negative for dysuria and urgency.  Musculoskeletal: Negative for myalgias.  Neurological: Positive for tremors and weakness. Negative for dizziness, speech change, focal weakness, seizures and headaches.  Psychiatric/Behavioral: Negative for depression.    DRUG ALLERGIES:  No Known Allergies  VITALS:  Blood pressure 122/78, pulse 98, temperature 98.2 F (36.8 C), temperature source Oral, resp. rate 18, height 5' 8"  (1.727 m), weight 65.772 kg (145 lb), SpO2 100 %.  PHYSICAL EXAMINATION:  Physical Exam  GENERAL:  25 y.o.-year-old patient lying in the bed with no acute distress. Jaundiced appearing EYES: Pupils equal, round, reactive to light and accommodation. Positive scleral icterus. Extraocular muscles intact.  HEENT: Head atraumatic, normocephalic.  Oropharynx and nasopharynx clear.  NECK:  Supple, no jugular venous distention. No thyroid enlargement, no tenderness.  LUNGS: Normal breath sounds bilaterally, no wheezing, rales,rhonchi or crepitation. No use of accessory muscles of respiration.  CARDIOVASCULAR: S1, S2 normal. No murmurs, rubs, or gallops.  ABDOMEN: Soft, nontender, nondistended. Bowel sounds present. No organomegaly or mass.  EXTREMITIES: No pedal edema, cyanosis, or clubbing.  NEUROLOGIC: Cranial nerves II through XII are intact. Muscle strength 5/5 in all extremities. Sensation intact. Gait not checked. Minimal tremors noted PSYCHIATRIC: The patient is alert and oriented x 3. No confusion, poor judgement  SKIN: No obvious rash, lesion, or ulcer.    LABORATORY PANEL:   CBC  Recent Labs Lab 01/06/16 0426  WBC 6.8  HGB 12.0  HCT 33.7*  PLT 183   ------------------------------------------------------------------------------------------------------------------  Chemistries   Recent Labs Lab 01/05/16 1344 01/06/16 0426  NA  --  125*  K  --  3.1*  CL  --  80*  CO2  --  25  GLUCOSE  --  139*  BUN  --  13  CREATININE  --  0.53  CALCIUM  --  9.3  MG 2.9*  2.6*  --   AST  --  452*  ALT  --  85*  ALKPHOS  --  220*  BILITOT  --  13.2*   ------------------------------------------------------------------------------------------------------------------  Cardiac Enzymes No results for input(s): TROPONINI in the last 168 hours. ------------------------------------------------------------------------------------------------------------------  RADIOLOGY:  US Abdomen Complete  01/05/2016  CLINICAL DATA:  Elevated liver function tests. EXAM: ABDOMEN ULTRASOUND COMPLETE COMPARISON:  None. FINDINGS: Gallbladder: Gallbladder is distended. There are no shadowing stones. There is no wall thickening or pericholecystic fluid. Common bile duct: Diameter: 5.6 mm Liver: Liver is enlarged  and echogenic. There is decreased  through transmission of the sound beam. No liver mass or focal lesion. IVC: No abnormality visualized. Pancreas: Not visualized due to midline bowel gas. Spleen: Size and appearance within normal limits. Right Kidney: Length: 14.2 cm. Echogenicity within normal limits. No mass or hydronephrosis visualized. Left Kidney: Length: 12.7 cm. Echogenicity within normal limits. No mass or hydronephrosis visualized. Abdominal aorta: Distal aorta obscured by bowel gas. No aneurysm seen. Other findings: None. IMPRESSION: 1. No acute findings. No gallstones or evidence of acute cholecystitis. 2. Hepatomegaly and extensive hepatic steatosis. Electronically Signed   By: Lajean Manes M.D.   On: 01/05/2016 15:41    EKG:   Orders placed or performed during the hospital encounter of 01/05/16  . EKG 12-Lead  . EKG 12-Lead    ASSESSMENT AND PLAN:   25 year old female with past medical history significant for alcohol abuse, , anxiety and depression admitted with alcoholic ketoacidosis and hyponatremia  #1 hyponatremia- hypovolemic and also beer potomania - change IV fluids to NS - Improved nausea and vomiting, advance her diet -Improving  #2 Acute alcoholic hepatitis- with elevated bili ad alk phos and AST - US abdomen with no gall stones or obstruction noted - agree with prednisone -Ammonia level is pending  #3 hypokalemia-being replaced  #4 alcohol abuse-strongly counseled again. Patient said that she has remained sober for 2 months last year and is interested in pursuing AA. -Further help, refused at this time and said that she would do it on her own.   Patient has been persistent in being discharged, extend to her that she is not stable enough for discharge. Has decided to sign AGAINST MEDICAL ADVICE in spite of knowing the risks.   All the records are reviewed and case discussed with Care Management/Social Workerr. Management plans discussed with the patient, family and they are in  agreement.  CODE STATUS: Full Code  TOTAL TIME TAKING CARE OF THIS PATIENT: 38 minutes.   POSSIBLE D/C IN 2 DAYS, DEPENDING ON CLINICAL CONDITION.   Gladstone Lighter M.D on 01/06/2016 at 10:08 AM  Between 7am to 6pm - Pager - [phone]  After 6pm go to [email or web address] - password [email or web address] [place] [facility]  Office  [phone]  CC: Primary care physician; WHITE, Orlene Och, NP

## 2016-01-06 NOTE — Discharge Summary (Signed)
[place] [facility] [place] NAME: Eileen Ramos    MR#:  [id number]  DATE OF BIRTH:  Apr 07, 1991  DATE OF ADMISSION:  01/05/2016 ADMITTING PHYSICIAN: Dustin Flock, MD  DATE OF DISCHARGE: Patient left against medical advise on 01/06/2016  PRIMARY CARE PHYSICIAN: WHITE, Orlene Och, NP    ADMISSION DIAGNOSIS:  Hypokalemia [V42.5] Alcoholic ketoacidosis [Z56.3] Hypomagnesemia [E83.42] Hyperbilirubinemia [E80.6] Hyponatremia [E87.1] Elevated liver function tests [O75.64] Alcoholic hepatitis without ascites [K70.10] Intractable vomiting with nausea, vomiting of unspecified type [R11.10]  DISCHARGE DIAGNOSIS:  Active Problems:   Alcoholic ketoacidosis   SECONDARY DIAGNOSIS:   Past Medical History  Diagnosis Date  . History of pancreatitis Oct 2016    diagnosed at [facility]  . H/O acute alcoholic hepatitis Oct 3329    Diagnosed at [facility]  . Alcoholic ketoacidosis Oct 2016    Diagnosed at [facility]  . Alcohol abuse   . Anxiety   . Depression     HOSPITAL COURSE:   25 year old female with past medical history significant for alcohol abuse, , anxiety and depression admitted with alcoholic ketoacidosis and hyponatremia  #1 hyponatremia- hypovolemic and also beer potomania - change IV fluids to NS - Improved nausea and vomiting, advance her diet -Improving  #2 Acute alcoholic hepatitis- with elevated bili ad alk phos and AST - US abdomen with no gall stones or obstruction noted - agree with prednisone -Ammonia level is pending  #3 hypokalemia-being replaced  #4 alcohol abuse-strongly counseled again. Patient said that she has remained sober for 2 months last year and is interested in pursuing AA. -Further help, refused at this time and said that she would do it on her own.   Patient has been persistent in being discharged, extend to her that she is not stable enough for discharge. Has decided to sign AGAINST MEDICAL ADVICE in  spite of knowing the risks.  DISCHARGE CONDITIONS:   Guarded  CONSULTS OBTAINED:  Treatment Team:  Manya Silvas, MD  DRUG ALLERGIES:  No Known Allergies  DISCHARGE MEDICATIONS:   Current Discharge Medication List    CONTINUE these medications which have NOT CHANGED   Details  albuterol (VENTOLIN HFA) 108 (90 Base) MCG/ACT inhaler Inhale 2 puffs into the lungs every 6 (six) hours as needed. For wheezing.    clonazePAM (KLONOPIN) 0.5 MG tablet Take 0.5 mg by mouth 3 (three) times daily as needed for anxiety.    lurasidone (LATUDA) 20 MG TABS tablet Take 20 mg by mouth at bedtime.     venlafaxine XR (EFFEXOR-XR) 150 MG 24 hr capsule Take 300 mg by mouth daily with breakfast.         DISCHARGE INSTRUCTIONS:   -Wise to stop alcohol and follow-up with AA meetings. -Patient left AMA  If you experience worsening of your admission symptoms, develop shortness of breath, life threatening emergency, suicidal or homicidal thoughts you must seek medical attention immediately by calling 911 or calling your MD immediately  if symptoms less severe.  You Must read complete instructions/literature along with all the possible adverse reactions/side effects for all the Medicines you take and that have been prescribed to you. Take any new Medicines after you have completely understood and accept all the possible adverse reactions/side effects.   Please note  You were cared for by a hospitalist during your hospital stay. If you have any questions about your discharge medications or the care you received while you were in the hospital after you are discharged, you  can call the unit and asked to speak with the hospitalist on call if the hospitalist that took care of you is not available. Once you are discharged, your primary care physician will handle any further medical issues. Please note that NO REFILLS for any discharge medications will be authorized once you are discharged, as it is  imperative that you return to your primary care physician (or establish a relationship with a primary care physician if you do not have one) for your aftercare needs so that they can reassess your need for medications and monitor your lab values.    Today   CHIEF COMPLAINT:   Chief Complaint  Patient presents with  . Vomiting  . Hematemesis    VITAL SIGNS:  Blood pressure 122/78, pulse 98, temperature 98.2 F (36.8 C), temperature source Oral, resp. rate 18, height 5' 8"  (1.727 m), weight 65.772 kg (145 lb), SpO2 100 %.  I/O:   Intake/Output Summary (Last 24 hours) at 01/06/16 1014 Last data filed at 01/06/16 0715  Gross per 24 hour  Intake 1815.67 ml  Output      0 ml  Net 1815.67 ml    PHYSICAL EXAMINATION:   Physical Exam  GENERAL: 25 y.o.-year-old patient lying in the bed with no acute distress. Jaundiced appearing EYES: Pupils equal, round, reactive to light and accommodation. Positive scleral icterus. Extraocular muscles intact.  HEENT: Head atraumatic, normocephalic. Oropharynx and nasopharynx clear.  NECK: Supple, no jugular venous distention. No thyroid enlargement, no tenderness.  LUNGS: Normal breath sounds bilaterally, no wheezing, rales,rhonchi or crepitation. No use of accessory muscles of respiration.  CARDIOVASCULAR: S1, S2 normal. No murmurs, rubs, or gallops.  ABDOMEN: Soft, nontender, nondistended. Bowel sounds present. No organomegaly or mass.  EXTREMITIES: No pedal edema, cyanosis, or clubbing.  NEUROLOGIC: Cranial nerves II through XII are intact. Muscle strength 5/5 in all extremities. Sensation intact. Gait not checked. Minimal tremors noted PSYCHIATRIC: The patient is alert and oriented x 3. No confusion, poor judgement  SKIN: No obvious rash, lesion, or ulcer. Marland Kitchen   DATA REVIEW:   CBC  Recent Labs Lab 01/06/16 0426  WBC 6.8  HGB 12.0  HCT 33.7*  PLT 183    Chemistries   Recent Labs Lab 01/05/16 1344 01/06/16 0426  NA  --   125*  K  --  3.1*  CL  --  80*  CO2  --  25  GLUCOSE  --  139*  BUN  --  13  CREATININE  --  0.53  CALCIUM  --  9.3  MG 2.9*  2.6*  --   AST  --  452*  ALT  --  85*  ALKPHOS  --  220*  BILITOT  --  13.2*    Cardiac Enzymes No results for input(s): TROPONINI in the last 168 hours.  Microbiology Results  No results found for this or any previous visit.  RADIOLOGY:  US Abdomen Complete  01/05/2016  CLINICAL DATA:  Elevated liver function tests. EXAM: ABDOMEN ULTRASOUND COMPLETE COMPARISON:  None. FINDINGS: Gallbladder: Gallbladder is distended. There are no shadowing stones. There is no wall thickening or pericholecystic fluid. Common bile duct: Diameter: 5.6 mm Liver: Liver is enlarged and echogenic. There is decreased through transmission of the sound beam. No liver mass or focal lesion. IVC: No abnormality visualized. Pancreas: Not visualized due to midline bowel gas. Spleen: Size and appearance within normal limits. Right Kidney: Length: 14.2 cm. Echogenicity within normal limits. No mass or hydronephrosis visualized. Left Kidney:  Length: 12.7 cm. Echogenicity within normal limits. No mass or hydronephrosis visualized. Abdominal aorta: Distal aorta obscured by bowel gas. No aneurysm seen. Other findings: None. IMPRESSION: 1. No acute findings. No gallstones or evidence of acute cholecystitis. 2. Hepatomegaly and extensive hepatic steatosis. Electronically Signed   By: Lajean Manes M.D.   On: 01/05/2016 15:41    EKG:   Orders placed or performed during the hospital encounter of 01/05/16  . EKG 12-Lead  . EKG 12-Lead      Management plans discussed with the patient, family and they are in agreement.  CODE STATUS:     Code Status Orders        Start     Ordered   01/05/16 1603  Full code   Continuous     01/05/16 1602    Code Status History    Date Active Date Inactive Code Status Order ID Comments User Context   This patient has a current code status but no  historical code status.      TOTAL TIME TAKING CARE OF THIS PATIENT: 37 minutes.    Gladstone Lighter M.D on 01/06/2016 at 10:14 AM  Between 7am to 6pm - Pager - [phone]  After 6pm go to [email or web address] - password [email or web address] [facility]  Office  [phone]  CC: Primary care physician; WHITE, Orlene Och, NP

## 2016-01-06 NOTE — Progress Notes (Signed)
Patient signed AMA paper, is packed and IV removed. Waiting on father to pick her up.

## 2016-01-06 NOTE — Progress Notes (Signed)
Patient states she wants to be discharged. Her father is at home with cancer and she needs to go home and take care of him. Dr Nemiah CommanderKalisetti notified of patients concerns, states she will go talk to the patient.

## 2016-01-06 NOTE — Progress Notes (Signed)
Dr Nemiah Commanderkalisetti spoke with patient about risks of leaving AMA, Patients still wants to leave AMA.

## 2016-01-07 LAB — HEPATITIS PANEL, ACUTE
HCV Ab: <0.1 s/co ratio (ref 0.0–0.9)
HEP B C IGM: NEGATIVE
HEP B S AG: NEGATIVE
Hep A IgM: NEGATIVE

## 2016-02-11 ENCOUNTER — Emergency Department: Payer: Self-pay

## 2016-02-11 ENCOUNTER — Emergency Department
Admission: EM | Admit: 2016-02-11 | Discharge: 2016-02-11 | Disposition: A | Discharge status: Home / Self Care | Payer: Self-pay | Attending: Emergency Medicine | Admitting: Emergency Medicine

## 2016-02-11 ENCOUNTER — Encounter: Payer: Self-pay | Admitting: Emergency Medicine

## 2016-02-11 DIAGNOSIS — F1210 Cannabis abuse, uncomplicated: Secondary | ICD-10-CM | POA: Insufficient documentation

## 2016-02-11 DIAGNOSIS — F17210 Nicotine dependence, cigarettes, uncomplicated: Secondary | ICD-10-CM | POA: Insufficient documentation

## 2016-02-11 DIAGNOSIS — F1010 Alcohol abuse, uncomplicated: Secondary | ICD-10-CM | POA: Insufficient documentation

## 2016-02-11 DIAGNOSIS — R0789 Other chest pain: Secondary | ICD-10-CM | POA: Insufficient documentation

## 2016-02-11 DIAGNOSIS — F329 Major depressive disorder, single episode, unspecified: Secondary | ICD-10-CM | POA: Insufficient documentation

## 2016-02-11 DIAGNOSIS — Z79899 Other long term (current) drug therapy: Secondary | ICD-10-CM | POA: Insufficient documentation

## 2016-02-11 LAB — LIPASE, BLOOD: Lipase: 20 U/L (ref 11–51)

## 2016-02-11 LAB — ETHANOL: Alcohol, Ethyl (B): 342 mg/dL (ref <5)

## 2016-02-11 LAB — CBC WITH DIFFERENTIAL/PLATELET
BASOS PCT: 1 %
Basophils Absolute: 0.1 10*3/uL (ref 0–0.1)
EOS PCT: 1 %
Eosinophils Absolute: 0.1 10*3/uL (ref 0–0.7)
HEMATOCRIT: 33.3 % — AB (ref 35.0–47.0)
Hemoglobin: 11.6 g/dL — ABNORMAL LOW (ref 12.0–16.0)
LYMPHS ABS: 1.1 10*3/uL (ref 1.0–3.6)
Lymphocytes Relative: 13 %
MCH: 35.6 pg — AB (ref 26.0–34.0)
MCHC: 34.7 g/dL (ref 32.0–36.0)
MCV: 102.6 fL — AB (ref 80.0–100.0)
MONO ABS: 0.4 10*3/uL (ref 0.2–0.9)
MONOS PCT: 5 %
Neutro Abs: 6.9 10*3/uL — ABNORMAL HIGH (ref 1.4–6.5)
Neutrophils Relative %: 80 %
Platelets: 150 10*3/uL (ref 150–440)
RBC: 3.25 MIL/uL — AB (ref 3.80–5.20)
RDW: 14.8 % — AB (ref 11.5–14.5)
WBC: 8.6 10*3/uL (ref 3.6–11.0)

## 2016-02-11 LAB — URINALYSIS COMPLETE WITH MICROSCOPIC (ARMC ONLY)
BACTERIA UA: NONE SEEN
Bilirubin Urine: NEGATIVE
Glucose, UA: NEGATIVE mg/dL
HGB URINE DIPSTICK: NEGATIVE
LEUKOCYTES UA: NEGATIVE
NITRITE: NEGATIVE
PROTEIN: 30 mg/dL — AB
RBC / HPF: NONE SEEN RBC/hpf (ref 0–5)
SPECIFIC GRAVITY, URINE: 1.013 (ref 1.005–1.030)
pH: 6 (ref 5.0–8.0)

## 2016-02-11 LAB — COMPREHENSIVE METABOLIC PANEL
ALBUMIN: 3.4 g/dL — AB (ref 3.5–5.0)
ALT: 50 U/L (ref 14–54)
ANION GAP: 22 — AB (ref 5–15)
AST: 408 U/L — ABNORMAL HIGH (ref 15–41)
Alkaline Phosphatase: 263 U/L — ABNORMAL HIGH (ref 38–126)
BILIRUBIN TOTAL: 4.5 mg/dL — AB (ref 0.3–1.2)
CALCIUM: 8.7 mg/dL — AB (ref 8.9–10.3)
CO2: 22 mmol/L (ref 22–32)
CREATININE: 0.53 mg/dL (ref 0.44–1.00)
Chloride: 95 mmol/L — ABNORMAL LOW (ref 101–111)
GLUCOSE: 81 mg/dL (ref 65–99)
POTASSIUM: 3.2 mmol/L — AB (ref 3.5–5.1)
Sodium: 139 mmol/L (ref 135–145)
TOTAL PROTEIN: 9.3 g/dL — AB (ref 6.5–8.1)

## 2016-02-11 LAB — MAGNESIUM: Magnesium: 1.9 mg/dL (ref 1.7–2.4)

## 2016-02-11 MED ORDER — SODIUM CHLORIDE 0.9 % IV SOLN
1.0000 mg | Freq: Once | INTRAVENOUS | Status: AC
  Administered 2016-02-11: 1 mg via INTRAVENOUS
  Filled 2016-02-11: qty 0.2

## 2016-02-11 MED ORDER — SODIUM CHLORIDE 0.9 % IV BOLUS (SEPSIS)
1000.0000 mL | Freq: Once | INTRAVENOUS | Status: AC
Start: 2016-02-11 — End: 2016-02-11
  Administered 2016-02-11: 1000 mL via INTRAVENOUS
  Filled 2016-02-11: qty 1000

## 2016-02-11 MED ORDER — ONDANSETRON HCL 4 MG/2ML IJ SOLN
4.0000 mg | Freq: Once | INTRAMUSCULAR | Status: AC
  Administered 2016-02-11: 4 mg via INTRAVENOUS
  Filled 2016-02-11: qty 2

## 2016-02-11 MED ORDER — THIAMINE HCL 100 MG/ML IJ SOLN
100.0000 mg | Freq: Once | INTRAMUSCULAR | Status: AC
  Administered 2016-02-11: 100 mg via INTRAVENOUS
  Filled 2016-02-11: qty 2

## 2016-02-11 NOTE — ED Provider Notes (Addendum)
[facility] Emergency Department Provider Note  ____________________________________________   I have reviewed the triage vital signs and the nursing notes.   HISTORY  Chief Complaint Abdominal Pain    HPI Eileen Ramos is a 25 y.o. female presents today complaining of a tender spot on the left rib cage where she thinks she may have bumped herself. Patient denies any fall however. She states is been hurting for a week or 2. She has been drinking as recently as this morning. She denies SI or HI.    Past Medical History  Diagnosis Date  . History of pancreatitis Oct 2016    [facility]  . H/O acute alcoholic hepatitis Oct 2016    [facility]  . Alcoholic ketoacidosis Oct 2016    [facility]  . Alcohol abuse   . Anxiety   . Depression     Patient Active Problem List   Diagnosis Date Noted  . Alcoholic ketoacidosis 01/05/2016    Past Surgical History  Procedure Laterality Date  . None      Current Outpatient Rx  Name  Route  Sig  Dispense  Refill  . albuterol (VENTOLIN HFA) 108 (90 Base) MCG/ACT inhaler   Inhalation   Inhale 2 puffs into the lungs every 6 (six) hours as needed. For wheezing.         . clonazePAM (KLONOPIN) 0.5 MG tablet   Oral   Take 0.5 mg by mouth 3 (three) times daily as needed for anxiety.         Marland Kitchen. lurasidone (LATUDA) 20 MG TABS tablet   Oral   Take 20 mg by mouth at bedtime.          Marland Kitchen. venlafaxine XR (EFFEXOR-XR) 150 MG 24 hr capsule   Oral   Take 300 mg by mouth daily with breakfast.           Allergies Review of patient's allergies indicates no known allergies.  Family History  Problem Relation Age of Onset  . Alcohol abuse Father     Social History Social History  Substance Use Topics  . Smoking status: Current Every Day Smoker -- 1.00 packs/day    Types: Cigarettes  . Smokeless tobacco: None  . Alcohol Use: 18.0 oz/week    30 Standard drinks or equivalent per  week     Comment: a couple of drinks every 4 to 5 days    Review of Systems Constitutional: No fever/chills Eyes: No visual changes. ENT: No sore throat. No stiff neck no neck pain Cardiovascular: Denies chest pain. Respiratory: Denies shortness of breath. Gastrointestinal:   no vomiting.  No diarrhea.  No constipation. Genitourinary: Negative for dysuria. Musculoskeletal: Negative lower extremity swelling Skin: Negative for rash. Neurological: Negative for headaches, focal weakness or numbness. 10-point ROS otherwise negative.  ____________________________________________   PHYSICAL EXAM:  VITAL SIGNS: ED Triage Vitals  Enc Vitals Group     BP 02/11/16 0857 134/90 mmHg     Pulse Rate 02/11/16 0857 110     Resp 02/11/16 0857 20     Temp 02/11/16 0857 97.9 F (36.6 C)     Temp Source 02/11/16 0857 Oral     SpO2 02/11/16 0857 100 %     Weight 02/11/16 0857 180 lb (81.647 kg)     Height 02/11/16 0857 5\' 8"  (1.727 m)     Head Cir --      Peak Flow --      Pain Score  02/11/16 0858 6     Pain Loc --      Pain Edu? --      Excl. in GC? --     Constitutional: Alert and oriented. Well appearing and in no acute distress.Smells of alcohol Eyes: Conjunctivae are normal. PERRL. EOMI. Head: Atraumatic. Nose: No congestion/rhinnorhea. Mouth/Throat: Mucous membranes are moist.  Oropharynx non-erythematous. Neck: No stridor.   Nontender with no meningismus Cardiovascular: Normal rate, regular rhythm. Grossly normal heart sounds.  Good peripheral circulation. Respiratory: Normal respiratory effort.  No retractions. Lungs CTAB. Chest: There is a point of tenderness in the left chest wall which is below the left breast, there is no crepitus is no flail chest, touching this area reproduces her pain. Slight bruise noted Abdominal: Soft and nontender. No distention. No guarding no rebound Back:  There is no focal tenderness or step off there is no midline tenderness there are no lesions  noted. there is no CVA tenderness Musculoskeletal: No lower extremity tenderness. No joint effusions, no DVT signs strong distal pulses no edema Neurologic:  Normal speech and language. No gross focal neurologic deficits are appreciated.  Skin:  Skin is warm, dry and intact. No rash noted. Psychiatric: Mood and affect are normal. Speech and behavior are normal.  ____________________________________________   LABS (all labs ordered are listed, but only abnormal results are displayed)  Labs Reviewed  COMPREHENSIVE METABOLIC PANEL - Abnormal; Notable for the following:    Potassium 3.2 (*)    Chloride 95 (*)    BUN <5 (*)    Calcium 8.7 (*)    Total Protein 9.3 (*)    Albumin 3.4 (*)    AST 408 (*)    Alkaline Phosphatase 263 (*)    Total Bilirubin 4.5 (*)    Anion gap 22 (*)    All other components within normal limits  ETHANOL - Abnormal; Notable for the following:    Alcohol, Ethyl (B) 342 (*)    All other components within normal limits  CBC WITH DIFFERENTIAL/PLATELET - Abnormal; Notable for the following:    RBC 3.25 (*)    Hemoglobin 11.6 (*)    HCT 33.3 (*)    MCV 102.6 (*)    MCH 35.6 (*)    RDW 14.8 (*)    Neutro Abs 6.9 (*)    All other components within normal limits  LIPASE, BLOOD  MAGNESIUM  URINALYSIS COMPLETEWITH MICROSCOPIC ([facility] ONLY)  POC URINE PREG, ED   ____________________________________________  EKG  I personally interpreted any EKGs ordered by me or triage  ____________________________________________  RADIOLOGY  I reviewed any imaging ordered by me or triage that were performed during my shift and, if possible, patient and/or family made aware of any abnormal findings. ____________________________________________   PROCEDURES  Procedure(s) performed: None  Critical Care performed: None  ____________________________________________   INITIAL IMPRESSION / ASSESSMENT AND PLAN / ED COURSE  Pertinent labs & imaging results that were  available during my care of the patient were reviewed by me and considered in my medical decision making (see chart for details).  Patient with very reproducible chest wall pain, she is intoxicated. She elected to go home at this time. I have explained to her that until her alcohol's comes down some is probably not safe for her to go home.At this time, there does not appear to be clinical evidence to support the diagnosis of pulmonary embolus, dissection, myocarditis, endocarditis, pericarditis, pericardial tamponade, acute coronary syndrome, pneumothorax, pneumonia, or any other acute intrathoracic  pathology that will require admission or acute intervention. Nor is there evidence of any significant intra-abdominal pathology causing this discomfort.  ----------------------------------------- 12:03 PM on 02/11/2016 -----------------------------------------  Patient is awake alert eating drinking and walking, she is insisting on discharge at this time, her father is here to give her a ride home. She refuses any further care for me. She does not appear clinically intoxicated at this time and we will discharge her. Return precautions and follow-up given and understood ____________________________________________   FINAL CLINICAL IMPRESSION(S) / ED DIAGNOSES  Final diagnoses:  None      This chart was dictated using voice recognition software.  Despite best efforts to proofread,  errors can occur which can change meaning.     Jeanmarie Plant, MD 02/11/16 1138  Jeanmarie Plant, MD 02/11/16 [id number]

## 2016-02-11 NOTE — ED Notes (Signed)
Pt given ginger ale and crackers.   Pt able to eat w/o difficulty.

## 2016-02-11 NOTE — ED Notes (Signed)
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  Pt reports intake of ETOH for the last several days.

## 2016-02-11 NOTE — Discharge Instructions (Signed)
Chest Wall Pain Chest wall pain is pain in or around the bones and muscles of your chest. Sometimes, an injury causes this pain. Sometimes, the cause may not be known. This pain may take several weeks or longer to get better. HOME CARE INSTRUCTIONS  Pay attention to any changes in your symptoms. Take these actions to help with your pain:   Rest as told by your health care provider.   Avoid activities that cause pain. These include any activities that use your chest muscles or your abdominal and side muscles to lift heavy items.   If directed, apply ice to the painful area:  Put ice in a plastic bag.  Place a towel between your skin and the bag.  Leave the ice on for 20 minutes, 2-3 times per day.  Take over-the-counter and prescription medicines only as told by your health care provider.  Do not use tobacco products, including cigarettes, chewing tobacco, and e-cigarettes. If you need help quitting, ask your health care provider.  Keep all follow-up visits as told by your health care provider. This is important. SEEK MEDICAL CARE IF:  You have a fever.  Your chest pain becomes worse.  You have new symptoms. SEEK IMMEDIATE MEDICAL CARE IF:  You have nausea or vomiting.  You feel sweaty or light-headed.  You have a cough with phlegm (sputum) or you cough up blood.  You develop shortness of breath.   This information is not intended to replace advice given to you by your health care provider. Make sure you discuss any questions you have with your health care provider.   Document Released: 09/28/2005 Document Revised: 06/19/2015 Document Reviewed: 12/24/2014 Elsevier Interactive Patient Education 2016 [employer].  Alcohol Abuse and Nutrition Alcohol abuse is any pattern of alcohol consumption that harms your health, relationships, or work. Alcohol abuse can affect how your body breaks down and absorbs nutrients from food by causing your liver to work abnormally.  Additionally, many people who abuse alcohol do not eat enough carbohydrates, protein, fat, vitamins, and minerals. This can cause poor nutrition (malnutrition) and a lack of nutrients (nutrient deficiencies), which can lead to further complications. Nutrients that are commonly lacking (deficient) among people who abuse alcohol include:  Vitamins.  Vitamin A. This is stored in your liver. It is important for your vision, metabolism, and ability to fight off infections (immunity).  B vitamins. These include vitamins such as folate, thiamin, and niacin. These are important in new cell growth and maintenance.  Vitamin C. This plays an important role in iron absorption, wound healing, and immunity.  Vitamin D. This is produced by your liver, but you can also get vitamin D from food. Vitamin D is necessary for your body to absorb and use calcium.  Minerals.  Calcium. This is important for your bones and your heart and blood vessel (cardiovascular) function.  Iron. This is important for blood, muscle, and nervous system functioning.  Magnesium. This plays an important role in muscle and nerve function, and it helps to control blood sugar and blood pressure.  Zinc. This is important for the normal function of your nervous system and digestive system (gastrointestinal tract). Nutrition is an essential component of therapy for alcohol abuse. Your health care provider or dietitian will work with you to design a plan that can help restore nutrients to your body and prevent potential complications. WHAT IS MY PLAN? Your dietitian may develop a specific diet plan that is based on your condition and any other  complications you may have. A diet plan will commonly include:  A balanced diet.  Grains: 6-8 oz per day.  Vegetables: 2-3 cups per day.  Fruits: 1-2 cups per day.  Meat and other protein: 5-6 oz per day.  Dairy: 2-3 cups per day.  Vitamin and mineral supplements. WHAT DO I NEED TO KNOW  ABOUT ALCOHOL AND NUTRITION?  Consume foods that are high in antioxidants, such as grapes, berries, nuts, green tea, and dark green and orange vegetables. This can help to counteract some of the stress that is placed on your liver by consuming alcohol.  Avoid food and drinks that are high in fat and sugar. Foods such as sugared soft drinks, salty snack foods, and candy contain empty calories. This means that they lack important nutrients such as protein, fiber, and vitamins.  Eat frequent meals and snacks. Try to eat 5-6 small meals each day.  Eat a variety of fresh fruits and vegetables each day. This will help you get plenty of water, fiber, and vitamins in your diet.  Drink plenty of water and other clear fluids. Try to drink at least 48-64 oz (1.5-2 L) of water per day.  If you are a vegetarian, eat a variety of protein-rich foods. Pair whole grains with plant-based proteins at meals and snacks to obtain the greatest nutrient benefit from your food. For example, eat rice with beans, put peanut butter on whole-grain toast, or eat oatmeal with sunflower seeds.  Soak beans and whole grains overnight before cooking. This can help your body to absorb the nutrients more easily.  Include foods fortified with vitamins and minerals in your diet. Commonly fortified foods include milk, orange juice, cereal, and bread.  If you are malnourished, your dietitian may recommend a high-protein, high-calorie diet. This may include:  2,000-3,000 calories (kilocalories) per day.  70-100 grams of protein per day.  Your health care provider may recommend a complete nutritional supplement beverage. This can help to restore calories, protein, and vitamins to your body. Depending on your condition, you may be advised to consume this instead of or in addition to meals.  Limit your intake of caffeine. Replace drinks like coffee and black tea with decaffeinated coffee and herbal tea.  Eat a variety of foods that  are high in omega fatty acids. These include fish, nuts and seeds, and soybeans. These foods may help your liver to recover and may also stabilize your mood.  Certain medicines may cause changes in your appetite, taste, and weight. Work with your health care provider and dietitian to make any adjustments to your medicines and diet plan.  Include other healthy lifestyle choices in your daily routine.  Be physically active.  Get enough sleep.  Spend time doing activities that you enjoy.  If you are unable to take in enough food and calories by mouth, your health care provider may recommend a feeding tube. This is a tube that passes through your nose and throat, directly into your stomach. Nutritional supplement beverages can be given to you through the feeding tube to help you get the nutrients you need.  Take vitamin or mineral supplements as recommended by your health care provider. WHAT FOODS CAN I EAT? Grains Enriched pasta. Enriched rice. Fortified whole-grain bread. Fortified whole-grain cereal. Barley. Brown rice. Quinoa. Millet. Vegetables All fresh, frozen, and canned vegetables. Spinach. Kale. Artichoke. Carrots. Winter squash and pumpkin. Sweet potatoes. Broccoli. Cabbage. Cucumbers. Tomatoes. Sweet peppers. Green beans. Peas. Corn. Fruits All fresh and frozen fruits. Berries.  Grapes. Mango. Papaya. Guava. Cherries. Apples. Bananas. Peaches. Plums. Pineapple. Watermelon. Cantaloupe. Oranges. Avocado. Meats and Other Protein Sources Beef liver. Lean beef. Pork. Fresh and canned chicken. Fresh fish. Oysters. Sardines. Canned tuna. Shrimp. Eggs with yolks. Nuts and seeds. Peanut butter. Beans and lentils. Soybeans. Tofu. Dairy Whole, low-fat, and nonfat milk. Whole, low-fat, and nonfat yogurt. Cottage cheese. Sour cream. Hard and soft cheeses. Beverages Water. Herbal tea. Decaffeinated coffee. Decaffeinated green tea. 100% fruit juice. 100% vegetable juice. Instant breakfast  shakes. Condiments Ketchup. Mayonnaise. Mustard. Salad dressing. Barbecue sauce. Sweets and Desserts Sugar-free ice cream. Sugar-free pudding. Sugar-free gelatin. Fats and Oils Butter. Vegetable oil, flaxseed oil, olive oil, and walnut oil. Other Complete nutrition shakes. Protein bars. Sugar-free gum. The items listed above may not be a complete list of recommended foods or beverages. Contact your dietitian for more options. WHAT FOODS ARE NOT RECOMMENDED? Grains Sugar-sweetened breakfast cereals. Flavored instant oatmeal. Fried breads. Vegetables Breaded or deep-fried vegetables. Fruits Dried fruit with added sugar. Candied fruit. Canned fruit in syrup. Meats and Other Protein Sources Breaded or deep-fried meats. Dairy Flavored milks. Fried cheese curds or fried cheese sticks. Beverages Alcohol. Sugar-sweetened soft drinks. Sugar-sweetened tea. Caffeinated coffee and tea. Condiments Sugar. Honey. Agave nectar. Molasses. Sweets and Desserts Chocolate. Cake. Cookies. Candy. Other Potato chips. Pretzels. Salted nuts. Candied nuts. The items listed above may not be a complete list of foods and beverages to avoid. Contact your dietitian for more information.   This information is not intended to replace advice given to you by your health care provider. Make sure you discuss any questions you have with your health care provider.   Document Released: 07/23/2005 Document Revised: 10/19/2014 Document Reviewed: 05/01/2014 Elsevier Interactive Patient Education 2016 [employer]  Alcohol Use Disorder Alcohol use disorder is a mental disorder. It is not a one-time incident of heavy drinking. Alcohol use disorder is the excessive and uncontrollable use of alcohol over time that leads to problems with functioning in one or more areas of daily living. People with this disorder risk harming themselves and others when they drink to excess. Alcohol use disorder also can cause other mental  disorders, such as mood and anxiety disorders, and serious physical problems. People with alcohol use disorder often misuse other drugs.  Alcohol use disorder is common and widespread. Some people with this disorder drink alcohol to cope with or escape from negative life events. Others drink to relieve chronic pain or symptoms of mental illness. People with a family history of alcohol use disorder are at higher risk of losing control and using alcohol to excess.  Drinking too much alcohol can cause injury, accidents, and health problems. One drink can be too much when you are:  Working.  Pregnant or breastfeeding.  Taking medicines. Ask your doctor.  Driving or planning to drive. SYMPTOMS  Signs and symptoms of alcohol use disorder may include the following:   Consumption ofalcohol inlarger amounts or over a longer period of time than intended.  Multiple unsuccessful attempts to cutdown or control alcohol use.   A great deal of time spent obtaining alcohol, using alcohol, or recovering from the effects of alcohol (hangover).  A strong desire or urge to use alcohol (cravings).   Continued use of alcohol despite problems at work, school, or home because of alcohol use.   Continued use of alcohol despite problems in relationships because of alcohol use.  Continued use of alcohol in situations when it is physically hazardous, such as driving a car.  Continued use of alcohol despite awareness of a physical or psychological problem that is likely related to alcohol use. Physical problems related to alcohol use can involve the brain, heart, liver, stomach, and intestines. Psychological problems related to alcohol use include intoxication, depression, anxiety, psychosis, delirium, and dementia.   The need for increased amounts of alcohol to achieve the same desired effect, or a decreased effect from the consumption of the same amount of alcohol (tolerance).  Withdrawal symptoms upon  reducing or stopping alcohol use, or alcohol use to reduce or avoid withdrawal symptoms. Withdrawal symptoms include:  Racing heart.  Hand tremor.  Difficulty sleeping.  Nausea.  Vomiting.  Hallucinations.  Restlessness.  Seizures. DIAGNOSIS Alcohol use disorder is diagnosed through an assessment by your health care provider. Your health care provider may start by asking three or four questions to screen for excessive or problematic alcohol use. To confirm a diagnosis of alcohol use disorder, at least two symptoms must be present within a 43-month period. The severity of alcohol use disorder depends on the number of symptoms:  Mild--two or three.  Moderate--four or five.  Severe--six or more. Your health care provider may perform a physical exam or use results from lab tests to see if you have physical problems resulting from alcohol use. Your health care provider may refer you to a mental health professional for evaluation. TREATMENT  Some people with alcohol use disorder are able to reduce their alcohol use to low-risk levels. Some people with alcohol use disorder need to quit drinking alcohol. When necessary, mental health professionals with specialized training in substance use treatment can help. Your health care provider can help you decide how severe your alcohol use disorder is and what type of treatment you need. The following forms of treatment are available:   Detoxification. Detoxification involves the use of prescription medicines to prevent alcohol withdrawal symptoms in the first week after quitting. This is important for people with a history of symptoms of withdrawal and for heavy drinkers who are likely to have withdrawal symptoms. Alcohol withdrawal can be dangerous and, in severe cases, cause death. Detoxification is usually provided in a hospital or in-patient substance use treatment facility.  Counseling or talk therapy. Talk therapy is provided by substance use  treatment counselors. It addresses the reasons people use alcohol and ways to keep them from drinking again. The goals of talk therapy are to help people with alcohol use disorder find healthy activities and ways to cope with life stress, to identify and avoid triggers for alcohol use, and to handle cravings, which can cause relapse.  Medicines.Different medicines can help treat alcohol use disorder through the following actions:  Decrease alcohol cravings.  Decrease the positive reward response felt from alcohol use.  Produce an uncomfortable physical reaction when alcohol is used (aversion therapy).  Support groups. Support groups are run by people who have quit drinking. They provide emotional support, advice, and guidance. These forms of treatment are often combined. Some people with alcohol use disorder benefit from intensive combination treatment provided by specialized substance use treatment centers. Both inpatient and outpatient treatment programs are available.   This information is not intended to replace advice given to you by your health care provider. Make sure you discuss any questions you have with your health care provider.   Document Released: 11/05/2004 Document Revised: 10/19/2014 Document Reviewed: 01/05/2013 Elsevier Interactive Patient Education [employer]

## 2016-02-11 NOTE — ED Notes (Addendum)
Pts family back in room, pt expresses desire to leave.  Pt able to walk w/o difficulty w/ no assist.

## 2016-02-22 ENCOUNTER — Emergency Department
Admission: EM | Admit: 2016-02-22 | Discharge: 2016-03-12 | Disposition: E | Payer: BLUE CROSS/BLUE SHIELD | Attending: Emergency Medicine | Admitting: Emergency Medicine

## 2016-02-22 DIAGNOSIS — I469 Cardiac arrest, cause unspecified: Secondary | ICD-10-CM

## 2016-02-22 DIAGNOSIS — F1010 Alcohol abuse, uncomplicated: Secondary | ICD-10-CM | POA: Insufficient documentation

## 2016-02-22 MED ORDER — NALOXONE HCL 2 MG/2ML IJ SOSY
PREFILLED_SYRINGE | INTRAMUSCULAR | Status: AC
  Filled 2016-02-22: qty 2

## 2016-02-23 MED ORDER — NALOXONE HCL 2 MG/2ML IJ SOSY
PREFILLED_SYRINGE | INTRAMUSCULAR | Status: AC | PRN
  Administered 2016-02-22: 2 mg via INTRAVENOUS

## 2016-02-23 MED FILL — Medication: Qty: 1 | Status: AC

## 2016-02-23 NOTE — ED Notes (Signed)
Friends at bedside.

## 2016-03-12 DIAGNOSIS — 419620001 Death: Secondary | SNOMED CT | POA: Insufficient documentation

## 2016-03-12 NOTE — Code Documentation (Signed)
Patient time of death occurred at 2312.

## 2016-03-12 NOTE — Code Documentation (Addendum)
Per EMS, patient was found by family unresponsive and dialed 911.  FD arrived first and delivered 3 shocks by AED starting at 2211.  EMS took over at 2217 and delivered a total of 6 more shocks.  Patient was in vfib until 2238 at which time went asystole and she never regained ROSC.  2 Narcan was delivered by EMS at 2219.  Patient went into vfib and 300mg  amiodarone was administered at 2227 and 150mg  more delivered at 2234.  They said patient had been vomiting all day and had a hx of cirrhosis.  EMS established (2) access: 20G Left Hand, 22G IO left knee below patella.  500mL of 0.9% NS was hanging of which patient got approximately 200mL of.  EMS were not able to obtain any vital signs other than a glu of 83.  EMS also stated they gave patient epi every 3 and 5 minutes and a grand total of 9 ampules.

## 2016-03-12 NOTE — ED Provider Notes (Signed)
[facility] Emergency Department Provider Note  ____________________________________________  Time seen: 11:00 PM on arrival by EMS  I have reviewed the triage vital signs and the nursing notes.   HISTORY  Chief Complaint No chief complaint on file. Unresponsiveness  Level 5 caveat:  Portions of the history and physical were unable to be obtained due to the patient's acute illness    HPI Eileen Ramos is a 25 y.o. female brought to the ED in cardiac arrest. Per EMS report, the patient has been vomiting for the past week and not feeling well, and tonight around 10:10 PM the family heard the patient collapse and fall to the floor in the house. They checked on her and she was unresponsive. EMS arrived on scene and found the patient to be in cardiac arrest. Initial rhythm was ventricular fibrillation. They delivered a total of 6 cardioversion shocks and 9 doses of epinephrine. They placed an intraosseous line in the left tibia. They administered 2 mg of Narcan without response.   Patient has a history of severe alcohol abuse resulting in hepatitis pancreatitis and ketoacidosis.      Past Medical History  Diagnosis Date  . History of pancreatitis Oct 2016    diagnosed at [facility]  . H/O acute alcoholic hepatitis Oct 2016    Diagnosed at [facility]  . Alcoholic ketoacidosis Oct 2016    Diagnosed at [facility]  . Alcohol abuse   . Anxiety   . Depression      Patient Active Problem List   Diagnosis Date Noted  . Alcoholic ketoacidosis 01/05/2016     Past Surgical History  Procedure Laterality Date  . None       Current Outpatient Rx  Name  Route  Sig  Dispense  Refill  . albuterol (VENTOLIN HFA) 108 (90 Base) MCG/ACT inhaler   Inhalation   Inhale 2 puffs into the lungs every 6 (six) hours as needed. For wheezing.         . clonazePAM (KLONOPIN) 0.5 MG tablet   Oral   Take 0.5 mg by mouth 3 (three) times daily as needed for anxiety.         Marland Kitchen  lurasidone (LATUDA) 20 MG TABS tablet   Oral   Take 20 mg by mouth at bedtime.          Marland Kitchen venlafaxine XR (EFFEXOR-XR) 150 MG 24 hr capsule   Oral   Take 300 mg by mouth daily with breakfast.            Allergies Review of patient's allergies indicates no known allergies.   Family History  Problem Relation Age of Onset  . Alcohol abuse Father     Social History Social History  Substance Use Topics  . Smoking status: Current Every Day Smoker -- 1.00 packs/day    Types: Cigarettes  . Smokeless tobacco: Not on file  . Alcohol Use: 18.0 oz/week    30 Standard drinks or equivalent per week     Comment: a couple of drinks every 4 to 5 days    Review of Systems Unable to obtain ____________________________________________   PHYSICAL EXAM:  VITAL SIGNS: ED Triage Vitals  Enc Vitals Group     BP --      Pulse --      Resp --      Temp --      Temp src --      SpO2 --      Weight --  Height --      Head Cir --      Peak Flow --      Pain Score --      Pain Loc --      Pain Edu? --      Excl. in GC? --     Vital signs reviewed, nursing assessments reviewed.   Constitutional:   Unresponsive Eyes:   Fixed and dilated at about 8 mm ENT   Head:   Normocephalic and atraumatic.   Nose:   Unremarkable   Mouth/Throat:   King airway in place, placed by EMS. Bloody emesis and secretions present around the mouth   Neck:   No obvious trauma, no subcutaneous emphysema Hematological/Lymphatic/Immunilogical:   No cervical lymphadenopathy. Cardiovascular:   Pulseless. No radial femoral or carotid pulses 110 initially. Good femoral pulses with CPR  Respiratory:   No spontaneous respirations. Symmetric breath sounds with bag ventilation Gastrointestinal:   Soft Non distended. No ecchymosis Musculoskeletal:   No obvious trauma. Intraosseous line in place in the left tibia, flushes well Neurologic:   GCS = 3 Skin:    Pale and gray, cool to touch   ____________________________________________    LABS (pertinent positives/negatives) (all labs ordered are listed, but only abnormal results are displayed) Labs Reviewed - No data to display ____________________________________________   EKG    ____________________________________________    RADIOLOGY    ____________________________________________   PROCEDURES CRITICAL CARE Performed by: Scotty Court, Lior Cartelli   Total critical care time: 5 minutes  Critical care time was exclusive of separately billable procedures and treating other patients.  Critical care was necessary to treat or prevent imminent or life-threatening deterioration.  Critical care was time spent personally by me on the following activities: development of treatment plan with patient and/or surrogate as well as nursing, discussions with consultants, evaluation of patient's response to treatment, examination of patient, obtaining history from patient or surrogate, ordering and performing treatments and interventions, ordering and review of laboratory studies, ordering and review of radiographic studies, pulse oximetry and re-evaluation of patient's condition.  Cardiopulmonary Resuscitation (CPR) Procedure Note Directed/Performed by: Sharman Cheek I personally directed ancillary staff and/or performed CPR in an effort to regain return of spontaneous circulation and to maintain cardiac, neuro and systemic perfusion.  Total CPR time: 10 minutes    ____________________________________________   INITIAL IMPRESSION / ASSESSMENT AND PLAN / ED COURSE  Pertinent labs & imaging results that were available during my care of the patient were reviewed by me and considered in my medical decision making (see chart for details).  Patient arrived to the emergency department and cardiac arrest. Per EMS report, the patient's initial rhythm was ventricular fibrillation. However after multiple doses of epinephrine and  shocks, the patient has been in asystole for the 20 minutes prior to arrival in the emergency department. Total down time on arrival to the emergency department is 45-50 minutes. Patient has severe medical problems at baseline owing to chronic alcohol abuse. Exam is significant for bloody emesis around the mouth, and she may have suffered an upper GI bleed that resulted in hypovolemic shock versus aspiration and asphyxiation.  This may also been due to severe metabolic acidosis as she does have a history of alcoholic ketoacidosis and by report has been vomiting and not able to eat or drink much over the past week. She was given additional Narcan in the emergency department without response. We continued CPR, however of multiple pulse checks remained in asystole, and bedside cardiac ultrasound  performed by me showed cardiac standstill. At 11:10 PM, I determined that further resuscitative efforts would have little chance of producing return of spontaneous circulation or any meaningful neurologic outcome. Resuscitation was terminated at that time.  At 2325, I discussed the case with Dr. Judithann Sheenankor the medical examiner, who indicates that he doesn't think this would be a suitable medical examiner's case. He suggests allowing the death certificate to be filled out by the primary care doctor as usual.  According to EMR, PCP is: [facility] [place]  [address] [place] [address]  [place], [place] [id number]  [phone]  Cyndie MullSantayana, Gloria Patricia, DO  [address] [address]  [place], [place] [address]  [phone]  [phone] ([address]Fax)   RandWhite, Arlyss RepressElizabeth Burney, NP  [address]  [place], [place] [address]  [phone]  [phone] (Fax)     At 2342, I discussed the patient's condition with her father, Rocky MorelClaude Brandis. Answered any questions he had.   ____________________________________________   FINAL CLINICAL IMPRESSION(S) / ED DIAGNOSES  Final diagnoses:  Cardiac asystole (HCC)  Death  Chronic  alcohol abuse       Portions of this note were generated with dragon dictation software. Dictation errors may occur despite best attempts at proofreading.   Sharman CheekPhillip Nikie Cid, MD 06/06/16 [phone]

## 2016-03-12 NOTE — ED Notes (Signed)
Eileen JoinerRebecca RN tried to start IV in right arm, but pt did not have good veins, she got blood for lab off the IO access at 2309

## 2016-03-12 NOTE — ED Notes (Signed)
Stafford MD stopped CPR for pulse check, patient had no pulse and was still in asystole.  He used US to verify heart was not beating.

## 2016-03-12 DEATH — deceased

## 2016-06-13 IMAGING — US US ABDOMEN COMPLETE
1 series · 14 of 25 positions shown · non-contrast
Comparison: None.

CLINICAL DATA: Elevated liver function tests.

EXAM:
ABDOMEN ULTRASOUND COMPLETE

[Series 1: us abdomen complete · 0.20mm/px · 14 of 87 slices shown]
[im 1/87]
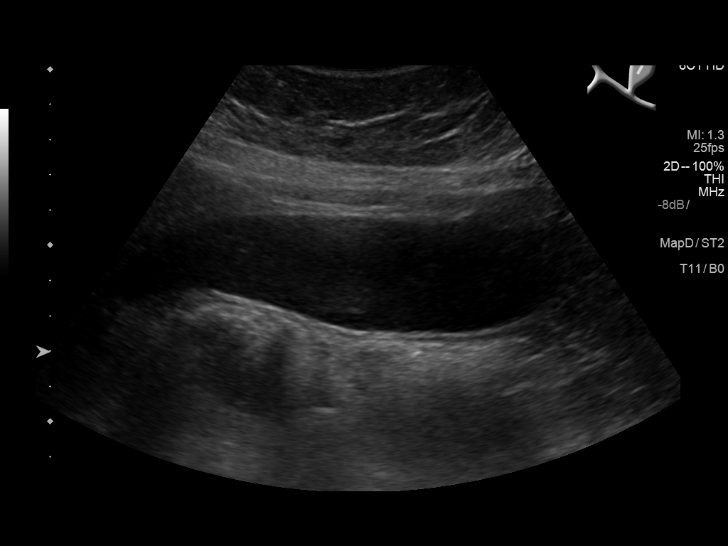
[im 8/87]
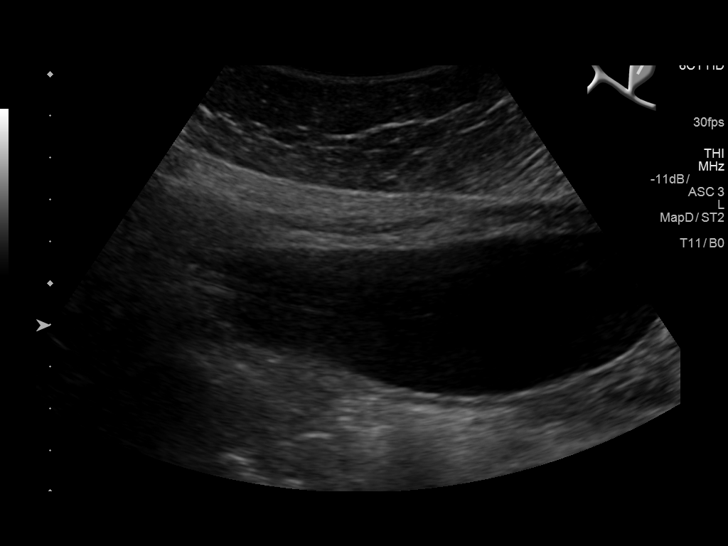
[im 15/87]
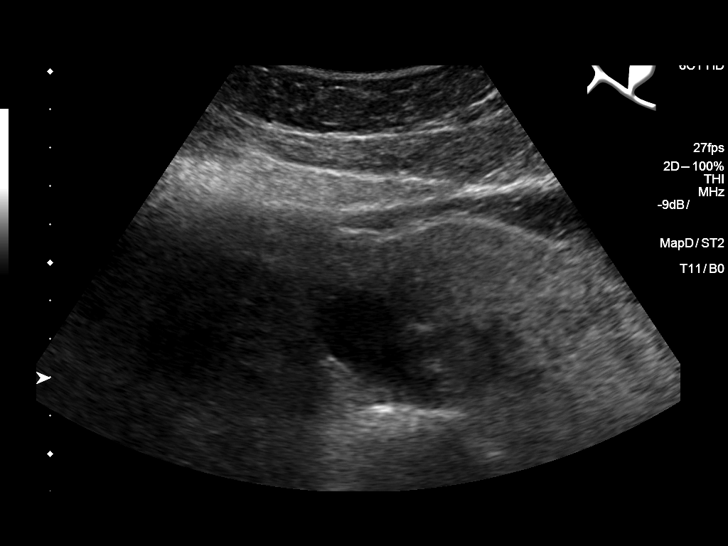
[im 22/87]
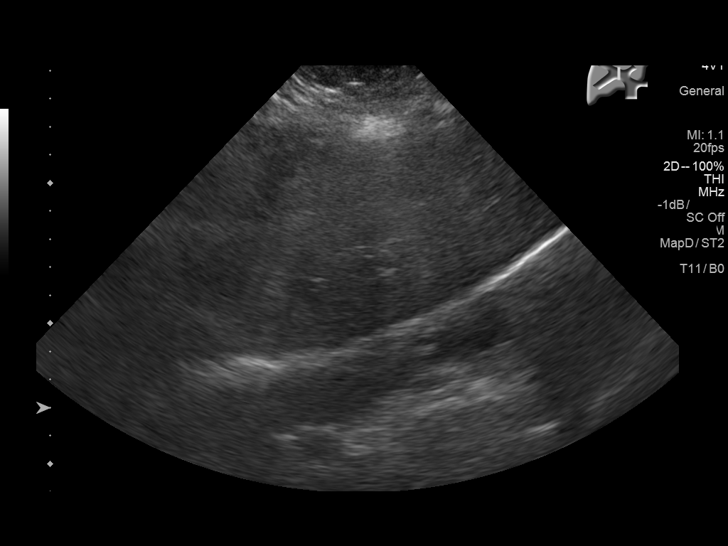
[im 29/87]
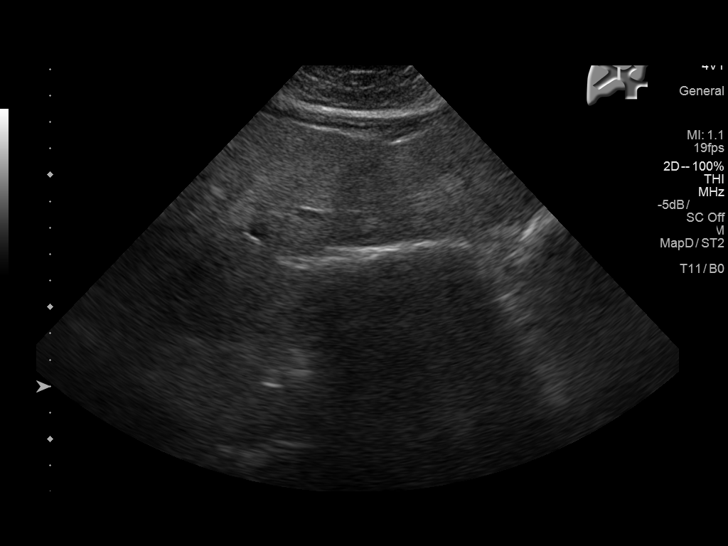
[im 33/87]
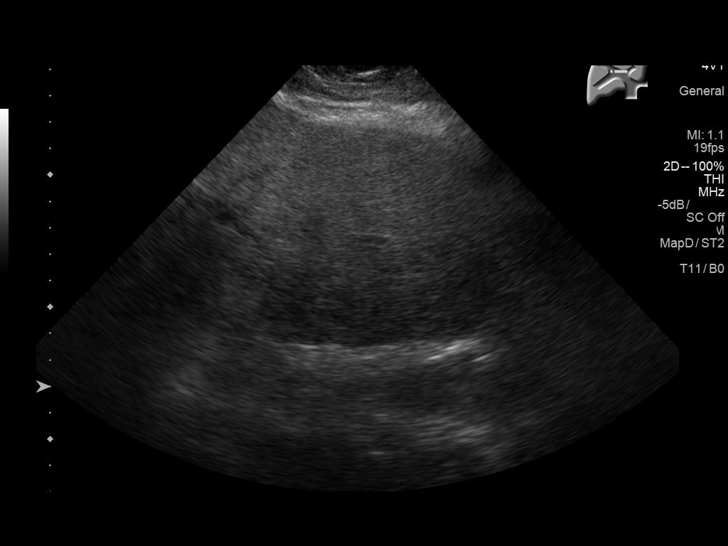
[im 40/87]
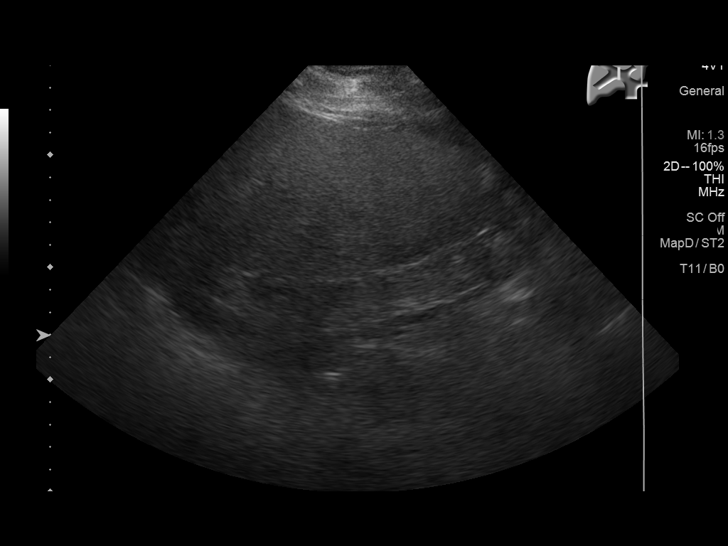
[im 47/87]
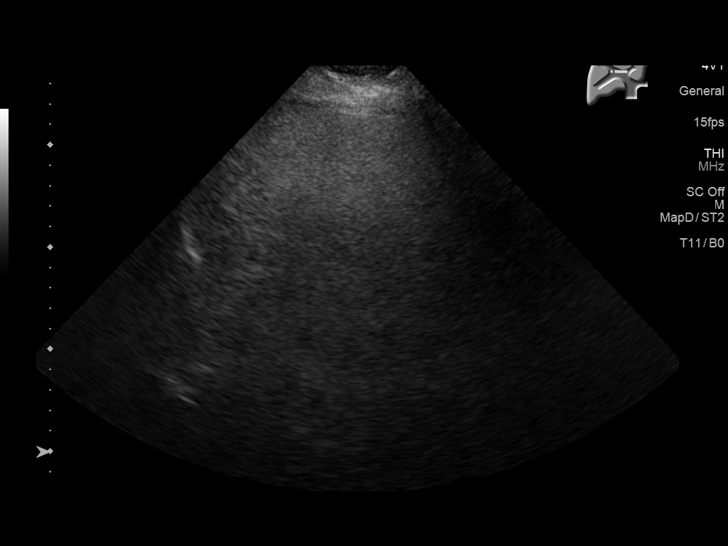
[im 54/87]
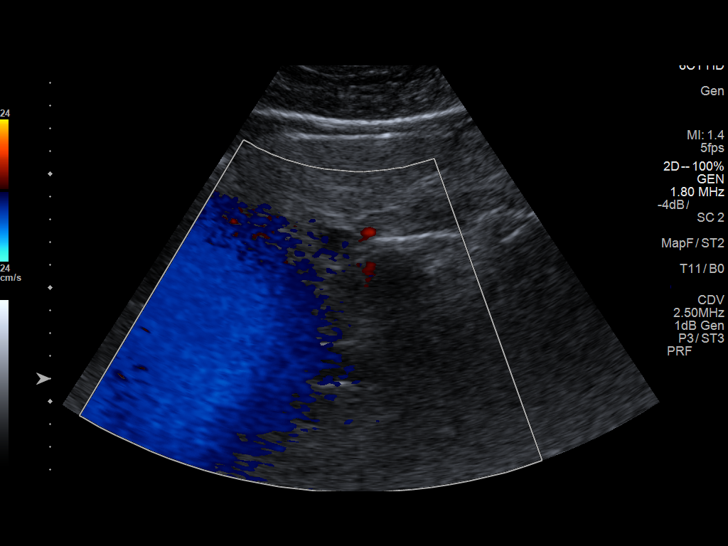
[im 58/87]
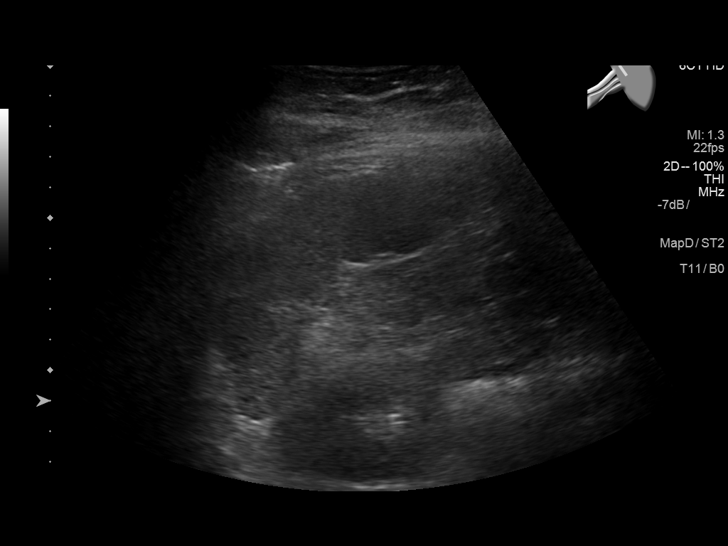
[im 65/87]
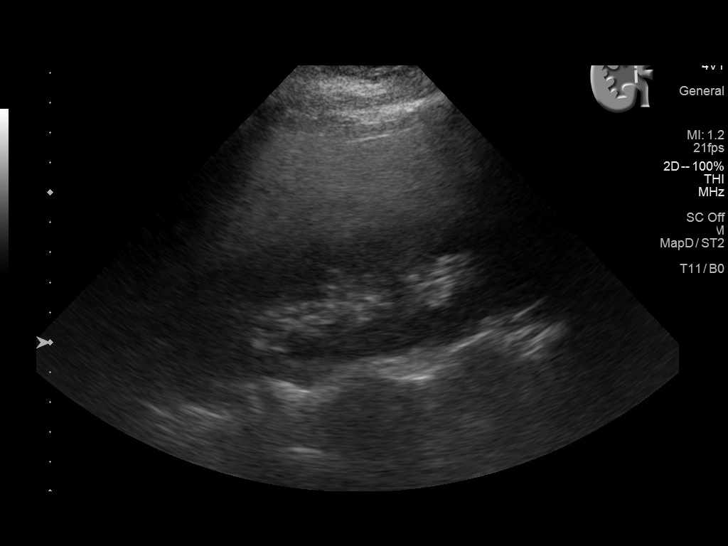
[im 72/87]
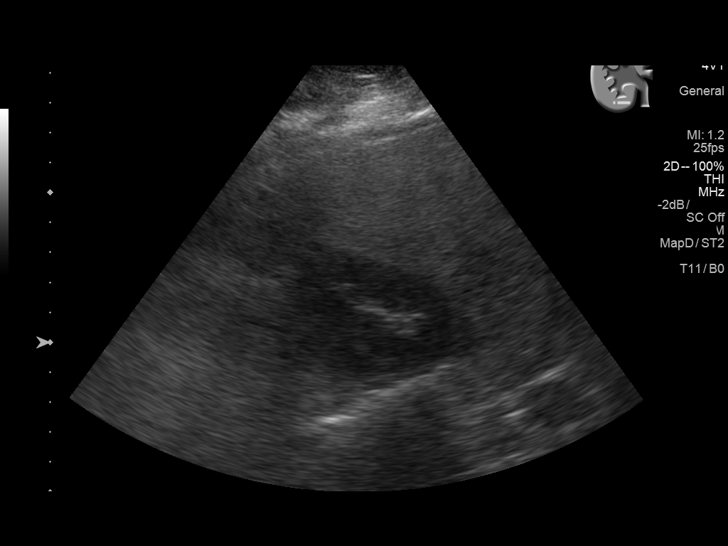
[im 79/87]
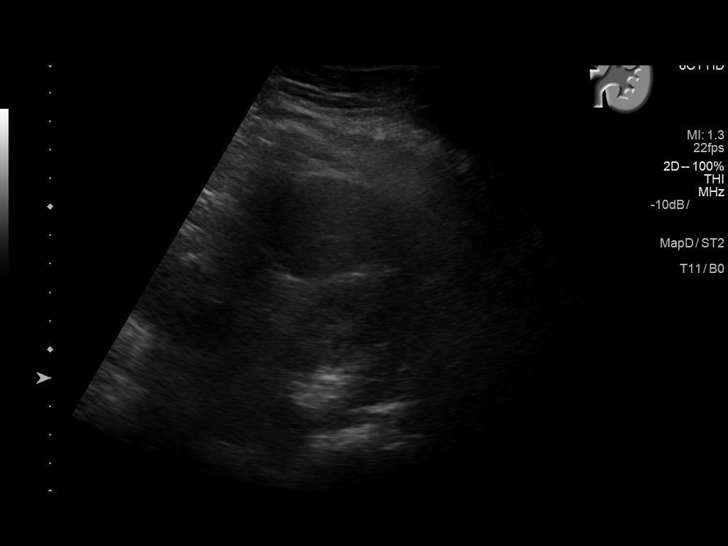
[im 87/87]
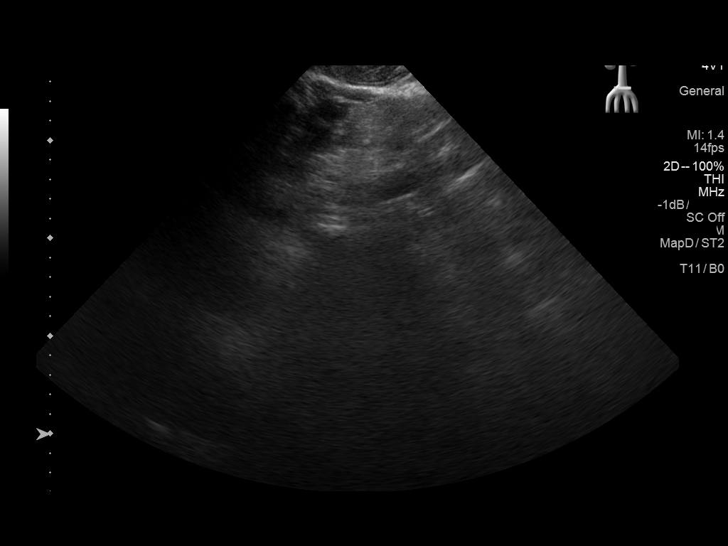

[14 of 25 positions shown; findings below may reference images not displayed]

FINDINGS: Gallbladder: Gallbladder is distended. There are no shadowing
stones. There is no wall thickening or pericholecystic fluid.

Common bile duct: Diameter: 5.6 mm

Liver: Liver is enlarged and echogenic. There is decreased through
transmission of the sound beam. No liver mass or focal lesion.

IVC: No abnormality visualized.

Pancreas: Not visualized due to midline bowel gas.

Spleen: Size and appearance within normal limits.

Right Kidney: Length: 14.2 cm. Echogenicity within normal limits. No
mass or hydronephrosis visualized.

Left Kidney: Length: 12.7 cm. Echogenicity within normal limits. No
mass or hydronephrosis visualized.

Abdominal aorta: Distal aorta obscured by bowel gas. No aneurysm
seen.

Other findings: None.
IMPRESSION: 1. No acute findings. No gallstones or evidence of acute
cholecystitis.
2. Hepatomegaly and extensive hepatic steatosis.
